# Patient Record
Sex: Female | Born: 1985
Health system: Southern US, Community
[De-identification: ages and names within clinical notes are randomized; demographics above are authoritative.]

## PROBLEM LIST (undated history)

## (undated) DIAGNOSIS — K219 Gastro-esophageal reflux disease without esophagitis: Secondary | ICD-10-CM

## (undated) DIAGNOSIS — N83209 Unspecified ovarian cyst, unspecified side: Secondary | ICD-10-CM

## (undated) DIAGNOSIS — E282 Polycystic ovarian syndrome: Secondary | ICD-10-CM

## (undated) HISTORY — DX: Gastro-esophageal reflux disease without esophagitis: K21.9

## (undated) HISTORY — DX: Polycystic ovarian syndrome: E28.2

---

## 2007-08-21 ENCOUNTER — Ambulatory Visit: Payer: Self-pay | Admitting: Emergency Medicine

## 2007-09-17 ENCOUNTER — Emergency Department: Payer: Self-pay | Admitting: Internal Medicine

## 2008-03-30 ENCOUNTER — Ambulatory Visit: Payer: Self-pay | Admitting: Internal Medicine

## 2010-03-14 ENCOUNTER — Emergency Department: Payer: Self-pay | Admitting: Emergency Medicine

## 2011-08-07 ENCOUNTER — Emergency Department: Payer: Self-pay | Admitting: Emergency Medicine

## 2011-09-28 ENCOUNTER — Emergency Department: Payer: Self-pay | Admitting: *Deleted

## 2012-02-26 ENCOUNTER — Emergency Department: Payer: Self-pay | Admitting: Emergency Medicine

## 2012-02-26 LAB — COMPREHENSIVE METABOLIC PANEL
Albumin: 3.9 g/dL (ref 3.4–5.0)
Alkaline Phosphatase: 57 U/L (ref 50–136)
Anion Gap: 10 (ref 7–16)
BUN: 14 mg/dL (ref 7–18)
Bilirubin,Total: 0.2 mg/dL (ref 0.2–1.0)
Calcium, Total: 9.4 mg/dL (ref 8.5–10.1)
Chloride: 108 mmol/L — ABNORMAL HIGH (ref 98–107)
Co2: 27 mmol/L (ref 21–32)
Creatinine: 0.73 mg/dL (ref 0.60–1.30)
EGFR (African American): >60
EGFR (Non-African Amer.): >60
Glucose: 123 mg/dL — ABNORMAL HIGH (ref 65–99)
Osmolality: 291 (ref 275–301)
Potassium: 3.8 mmol/L (ref 3.5–5.1)
SGOT(AST): 23 U/L (ref 15–37)
SGPT (ALT): 19 U/L
Sodium: 145 mmol/L (ref 136–145)
Total Protein: 8.6 g/dL — ABNORMAL HIGH (ref 6.4–8.2)

## 2012-02-26 LAB — URINALYSIS, COMPLETE
Bilirubin,UR: NEGATIVE
Glucose,UR: NEGATIVE mg/dL (ref 0–75)
Ketone: NEGATIVE
Nitrite: NEGATIVE
Ph: 5 (ref 4.5–8.0)
Protein: NEGATIVE
RBC,UR: 3 /HPF (ref 0–5)
Specific Gravity: 1.024 (ref 1.003–1.030)
Squamous Epithelial: 165
WBC UR: 13 /HPF (ref 0–5)

## 2012-02-26 LAB — CBC
HCT: 38.9 % (ref 35.0–47.0)
HGB: 13.2 g/dL (ref 12.0–16.0)
MCH: 28.3 pg (ref 26.0–34.0)
MCHC: 33.9 g/dL (ref 32.0–36.0)
MCV: 84 fL (ref 80–100)
Platelet: 233 10*3/uL (ref 150–440)
RBC: 4.66 10*6/uL (ref 3.80–5.20)
RDW: 13.7 % (ref 11.5–14.5)
WBC: 8.1 10*3/uL (ref 3.6–11.0)

## 2012-02-26 LAB — PREGNANCY, URINE: Pregnancy Test, Urine: NEGATIVE m[IU]/mL

## 2012-08-01 ENCOUNTER — Emergency Department: Payer: Self-pay | Admitting: Emergency Medicine

## 2012-12-19 ENCOUNTER — Emergency Department: Payer: Self-pay | Admitting: Emergency Medicine

## 2012-12-19 LAB — URINALYSIS, COMPLETE
Bilirubin,UR: NEGATIVE
Glucose,UR: NEGATIVE mg/dL (ref 0–75)
Leukocyte Esterase: NEGATIVE
Ph: 6 (ref 4.5–8.0)
RBC,UR: 1 /HPF (ref 0–5)
Specific Gravity: 1.003 (ref 1.003–1.030)
Squamous Epithelial: 31
WBC UR: 2 /HPF (ref 0–5)

## 2012-12-19 LAB — WET PREP, GENITAL

## 2012-12-21 LAB — BETA STREP CULTURE(ARMC)

## 2013-10-03 ENCOUNTER — Emergency Department: Payer: Self-pay | Admitting: Emergency Medicine

## 2013-10-03 LAB — URINALYSIS, COMPLETE
Bacteria: NONE SEEN
Glucose,UR: NEGATIVE mg/dL (ref 0–75)
Ketone: NEGATIVE
Ph: 6 (ref 4.5–8.0)
Protein: NEGATIVE
Specific Gravity: 1.028 (ref 1.003–1.030)
Squamous Epithelial: 11

## 2013-10-03 LAB — COMPREHENSIVE METABOLIC PANEL
Alkaline Phosphatase: 69 U/L (ref 50–136)
Anion Gap: 6 — ABNORMAL LOW (ref 7–16)
Bilirubin,Total: 0.2 mg/dL (ref 0.2–1.0)
Calcium, Total: 9.1 mg/dL (ref 8.5–10.1)
Creatinine: 0.84 mg/dL (ref 0.60–1.30)
EGFR (African American): >60
EGFR (Non-African Amer.): >60
Glucose: 93 mg/dL (ref 65–99)
Osmolality: 279 (ref 275–301)
Potassium: 3.9 mmol/L (ref 3.5–5.1)
SGOT(AST): 19 U/L (ref 15–37)

## 2013-10-03 LAB — GC/CHLAMYDIA PROBE AMP

## 2013-10-03 LAB — CBC
Platelet: 225 10*3/uL (ref 150–440)
RBC: 4.44 10*6/uL (ref 3.80–5.20)
RDW: 13.6 % (ref 11.5–14.5)

## 2013-10-03 LAB — WET PREP, GENITAL

## 2014-04-08 ENCOUNTER — Emergency Department (INDEPENDENT_AMBULATORY_CARE_PROVIDER_SITE_OTHER)
Admission: EM | Admit: 2014-04-08 | Discharge: 2014-04-08 | Disposition: A | Discharge status: Home / Self Care | Payer: Medicare Other | Source: Home / Self Care

## 2014-04-08 ENCOUNTER — Encounter (HOSPITAL_COMMUNITY): Payer: Self-pay | Admitting: Emergency Medicine

## 2014-04-08 ENCOUNTER — Emergency Department (INDEPENDENT_AMBULATORY_CARE_PROVIDER_SITE_OTHER): Payer: Medicare Other

## 2014-04-08 DIAGNOSIS — K5900 Constipation, unspecified: Secondary | ICD-10-CM

## 2014-04-08 DIAGNOSIS — K2900 Acute gastritis without bleeding: Secondary | ICD-10-CM

## 2014-04-08 DIAGNOSIS — H01009 Unspecified blepharitis unspecified eye, unspecified eyelid: Secondary | ICD-10-CM

## 2014-04-08 DIAGNOSIS — H01006 Unspecified blepharitis left eye, unspecified eyelid: Secondary | ICD-10-CM

## 2014-04-08 LAB — POCT I-STAT, CHEM 8
BUN: 12 mg/dL (ref 6–23)
CHLORIDE: 103 meq/L (ref 96–112)
Calcium, Ion: 1.23 mmol/L (ref 1.12–1.23)
Creatinine, Ser: 0.8 mg/dL (ref 0.50–1.10)
GLUCOSE: 89 mg/dL (ref 70–99)
HCT: 42 % (ref 36.0–46.0)
Hemoglobin: 14.3 g/dL (ref 12.0–15.0)
POTASSIUM: 4.1 meq/L (ref 3.7–5.3)
Sodium: 140 mEq/L (ref 137–147)
TCO2: 24 mmol/L (ref 0–100)

## 2014-04-08 LAB — POCT PREGNANCY, URINE: Preg Test, Ur: NEGATIVE

## 2014-04-08 MED ORDER — GI COCKTAIL ~~LOC~~
ORAL | Status: AC
  Filled 2014-04-08: qty 30

## 2014-04-08 MED ORDER — GI COCKTAIL ~~LOC~~: 30.0000 mL | Freq: Two times a day (BID) | ORAL | Status: DC

## 2014-04-08 MED ORDER — POLYMYXIN B-TRIMETHOPRIM 10000-0.1 UNIT/ML-% OP SOLN: 1.0000 [drp] | OPHTHALMIC | Status: DC

## 2014-04-08 MED ORDER — OMEPRAZOLE 20 MG PO CPDR: 20.0000 mg | DELAYED_RELEASE_CAPSULE | Freq: Every day | ORAL | Status: DC

## 2014-04-08 MED ORDER — GI COCKTAIL ~~LOC~~
30.0000 mL | Freq: Once | ORAL | Status: AC
  Administered 2014-04-08: 30 mL via ORAL

## 2014-04-08 MED ORDER — ONDANSETRON HCL 4 MG PO TABS: 4.0000 mg | ORAL_TABLET | Freq: Four times a day (QID) | ORAL | Status: DC

## 2014-04-08 MED ORDER — FAMOTIDINE 20 MG PO TABS
20.0000 mg | ORAL_TABLET | Freq: Once | ORAL | Status: AC
  Administered 2014-04-08: 20 mg via ORAL

## 2014-04-08 MED ORDER — FAMOTIDINE 20 MG PO TABS
ORAL_TABLET | ORAL | Status: AC
Start: 2014-04-08 — End: 2014-04-08
  Filled 2014-04-08: qty 1

## 2014-04-08 NOTE — ED Notes (Signed)
Patient returned to department with script wanting breakdown of gicocktail

## 2014-04-08 NOTE — ED Notes (Signed)
Abdominal pain and left eye swelling and pain.

## 2014-04-08 NOTE — Discharge Instructions (Signed)

## 2014-04-08 NOTE — ED Provider Notes (Signed)
CSN: [id number]     Arrival date & time 04/08/14  1235 History   First MD Initiated Contact with Patient 04/08/14 1358     Chief Complaint  Patient presents with  . Abdominal Pain  . Eye Pain   (Consider location/radiation/quality/duration/timing/severity/associated sxs/prior Treatment) HPI Comments: 28 year old female complaining of a sudden onset of midepigastric pain beginning at 0300 hours this morning. She vomited once during this time. Denies diarrhea, fever, chills, chest pain or shortness of breath. She recalls an episode similar to this approximately 2 years ago. She was seen at another facility and told she had inflammation in the stomach.  Can not remember the last time she had a nl BM. Yesterday small volume of hard stool.   History reviewed. No pertinent past medical history. History reviewed. No pertinent past surgical history. No family history on file. History  Substance Use Topics  . Smoking status: Never Smoker   . Smokeless tobacco: Not on file  . Alcohol Use: No   OB History   Grav Para Term Preterm Abortions TAB SAB Ect Mult Living                 Review of Systems  Constitutional: Positive for activity change and appetite change. Negative for fever and fatigue.  Respiratory: Negative.  Negative for cough, shortness of breath, wheezing and stridor.   Cardiovascular: Negative.   Gastrointestinal: Positive for vomiting, abdominal pain and constipation. Negative for nausea, diarrhea, blood in stool, abdominal distention and rectal pain.  Genitourinary: Negative.   Skin: Negative for rash.  Neurological: Negative.   Psychiatric/Behavioral: Negative.     Allergies  Review of patient's allergies indicates no known allergies.  Home Medications   Current Outpatient Rx  Name  Route  Sig  Dispense  Refill  . Alum & Mag Hydroxide-Simeth (GI COCKTAIL) SUSP suspension   Oral   Take 30 mLs by mouth 2 (two) times daily. As needed for stomach pain. Shake well.  200 mL   0   . omeprazole (PRILOSEC) 20 MG capsule   Oral   Take 1 capsule (20 mg total) by mouth daily.   30 capsule   0   . ondansetron (ZOFRAN) 4 MG tablet   Oral   Take 1 tablet (4 mg total) by mouth every 6 (six) hours. Prn nausea or vomiting   12 tablet   0    BP 112/77  Pulse 86  Temp(Src) 98.1 F (36.7 C) (Oral)  Resp 20  SpO2 100%  LMP 03/13/2014 Physical Exam  Nursing note and vitals reviewed. Constitutional: She is oriented to person, place, and time. She appears well-developed and well-nourished. No distress.  HENT:  Mouth/Throat: Oropharynx is clear and moist. No oropharyngeal exudate.  Eyes: Conjunctivae and EOM are normal.  Mild puffiness and redness to left upper lid.  Neck: Normal range of motion. Neck supple.  Cardiovascular: Normal rate, regular rhythm and normal heart sounds.   Pulmonary/Chest: Effort normal and breath sounds normal. No respiratory distress. She has no wheezes.  Abdominal: Soft. Bowel sounds are normal. She exhibits no mass. There is tenderness. There is guarding. There is no rebound.  Obese Tenderness primarily to the epigastrium and left to the MCL. Varying areas of tympani and dullness. Neg Murphy's sign.  Musculoskeletal: She exhibits no edema.  Lymphadenopathy:    She has no cervical adenopathy.  Neurological: She is alert and oriented to person, place, and time.  Skin: Skin is warm and dry.  Psychiatric: She has a  normal mood and affect.    ED Course  Procedures (including critical care time) Labs Review Labs Reviewed  POCT I-STAT, CHEM 8  POCT PREGNANCY, URINE   Imaging Review Dg Abd 2 Views  04/08/2014   CLINICAL DATA:  epigastric pain  EXAM: ABDOMEN - 2 VIEW  COMPARISON:  None.  FINDINGS: The bowel gas pattern is normal. There is no evidence of free air. No radio-opaque calculi or other significant radiographic abnormality is seen.  IMPRESSION: Negative.   Electronically Signed   By: Salome HolmesHector  Cooper M.D.   On:  04/08/2014 14:38     MDM   1. Gastritis, acute     Suspect gastritis, Diff: viral, PUD, helicobacter.  Post GI cocktail and famotidine stomach is feeling better, less pain. No V or D.  Miralax and lots water for constipation when stomach is better. Polytrim op for OS.      Hayden Rasmussenavid Fiora Weill, NP 04/08/14 1536

## 2014-04-08 NOTE — ED Notes (Signed)
Patient transported to X-ray 

## 2014-04-10 NOTE — ED Provider Notes (Signed)
Medical screening examination/treatment/procedure(s) were performed by resident physician or non-physician practitioner and as supervising physician I was immediately available for consultation/collaboration.   Sakia Schrimpf DOUGLAS MD.   Sarabeth Benton D Arminda Foglio, MD 04/10/14 1019 

## 2014-06-15 ENCOUNTER — Encounter (HOSPITAL_COMMUNITY): Payer: Self-pay | Admitting: Emergency Medicine

## 2014-06-15 ENCOUNTER — Emergency Department (HOSPITAL_COMMUNITY)
Admission: EM | Admit: 2014-06-15 | Discharge: 2014-06-16 | Disposition: A | Discharge status: Home / Self Care | Payer: Medicare Other | Attending: Emergency Medicine | Admitting: Emergency Medicine

## 2014-06-15 DIAGNOSIS — J3489 Other specified disorders of nose and nasal sinuses: Secondary | ICD-10-CM | POA: Diagnosis present

## 2014-06-15 DIAGNOSIS — J029 Acute pharyngitis, unspecified: Secondary | ICD-10-CM | POA: Insufficient documentation

## 2014-06-15 DIAGNOSIS — H9209 Otalgia, unspecified ear: Secondary | ICD-10-CM | POA: Diagnosis not present

## 2014-06-15 LAB — RAPID STREP SCREEN (MED CTR MEBANE ONLY): Streptococcus, Group A Screen (Direct): NEGATIVE

## 2014-06-15 MED ORDER — CETIRIZINE-PSEUDOEPHEDRINE ER 5-120 MG PO TB12: 1.0000 | ORAL_TABLET | Freq: Two times a day (BID) | ORAL | Status: DC

## 2014-06-15 MED ORDER — GUAIFENESIN ER 600 MG PO TB12: 600.0000 mg | ORAL_TABLET | Freq: Two times a day (BID) | ORAL | Status: DC

## 2014-06-15 MED ORDER — GI COCKTAIL ~~LOC~~
30.0000 mL | Freq: Once | ORAL | Status: AC
  Administered 2014-06-16: 30 mL via ORAL
  Filled 2014-06-15: qty 30

## 2014-06-15 MED ORDER — DIPHENHYDRAMINE HCL 12.5 MG/5ML PO SYRP: 25.0000 mg | ORAL_SOLUTION | Freq: Four times a day (QID) | ORAL | Status: DC

## 2014-06-15 MED ORDER — IBUPROFEN 400 MG PO TABS
800.0000 mg | ORAL_TABLET | Freq: Once | ORAL | Status: AC
  Administered 2014-06-16: 800 mg via ORAL
  Filled 2014-06-15: qty 2

## 2014-06-15 NOTE — ED Provider Notes (Signed)
CSN: [id number]     Arrival date & time 06/15/14  2211 History   First MD Initiated Contact with Patient 06/15/14 2345     Chief Complaint  Patient presents with  . Sore Throat  . Otalgia   HPI  History provided by the patient. The patient is a 28 year old female with no significant PMH presenting with complaints of sore throat and left earache. Symptoms first began earlier that day and have been persistent. She has pain in the throat anytime she tries to swallow or eat. Symptoms have been associated with some nasal congestion and drainage. She denies any coughing, nausea or vomiting. Denies any fever. Denies any known sick contacts. No recent travel. She has not used any medications to help with symptoms.    History reviewed. No pertinent past medical history. History reviewed. No pertinent past surgical history. No family history on file. History  Substance Use Topics  . Smoking status: Never Smoker   . Smokeless tobacco: Not on file  . Alcohol Use: No   OB History   Grav Para Term Preterm Abortions TAB SAB Ect Mult Living                 Review of Systems  Constitutional: Positive for chills. Negative for fever and diaphoresis.  HENT: Positive for congestion, rhinorrhea and sore throat.   Respiratory: Negative for cough.   Gastrointestinal: Negative for vomiting and diarrhea.  All other systems reviewed and are negative.     Allergies  Review of patient's allergies indicates no known allergies.  Home Medications   Prior to Admission medications   Medication Sig Start Date End Date Taking? Authorizing Provider  ondansetron (ZOFRAN) 4 MG tablet Take 1 tablet (4 mg total) by mouth every 6 (six) hours. Prn nausea or vomiting 04/08/14  Yes Hayden Rasmussenavid Mabe, NP   BP 118/76  Pulse 72  Temp(Src) 98.4 F (36.9 C) (Oral)  Resp 20  Wt 179 lb 12.8 oz (81.557 kg)  SpO2 100% Physical Exam  Nursing note and vitals reviewed. Constitutional: She is oriented to person, place, and  time. She appears well-developed and well-nourished. No distress.  HENT:  Head: Normocephalic and atraumatic.  Right Ear: Tympanic membrane normal.  Left Ear: Tympanic membrane normal.  Mouth/Throat: Oropharynx is clear and moist.  There is mild erythema and cobblestoning of the pharynx. Tonsils appear normal without erythema or exudate. Uvula midline.  Neck: Normal range of motion. Neck supple.  Cardiovascular: Normal rate and regular rhythm.   Pulmonary/Chest: Effort normal and breath sounds normal. No respiratory distress. She has no wheezes. She has no rales.  Abdominal: Soft.  Musculoskeletal: Normal range of motion.  Lymphadenopathy:    She has no cervical adenopathy.  Neurological: She is alert and oriented to person, place, and time.  Skin: Skin is warm and dry. No rash noted.  Psychiatric: She has a normal mood and affect. Her behavior is normal.       ED Course  Procedures   COORDINATION OF CARE:  Nursing notes reviewed. Vital signs reviewed. Initial pt interview and examination performed.   Filed Vitals:   06/15/14 2246 06/15/14 2347  BP: 102/77 118/76  Pulse: 87 72  Temp: 98.5 F (36.9 C) 98.4 F (36.9 C)  TempSrc: Oral Oral  Resp: 16 20  Weight: 179 lb 12.8 oz (81.557 kg)   SpO2: 100% 100%    11:50 PM-patient seen and evaluated. Patient well-appearing no acute distress. Afebrile. No concerning findings on exam. Negative strep test.  Suspect viral pharyngitis with postnasal drip.   Treatment plan initiated: Medications  ibuprofen (ADVIL,MOTRIN) tablet 800 mg (not administered)    Results for orders placed during the hospital encounter of 06/15/14  RAPID STREP SCREEN      Result Value Ref Range   Streptococcus, Group A Screen (Direct) NEGATIVE  NEGATIVE     MDM   Final diagnoses:  Pharyngitis        Angus Sellereter S Dammen, PA-C 06/16/14 [phone]

## 2014-06-15 NOTE — ED Notes (Signed)
Pt. reports left ear ache and sore throat with swelling onset today , denies fever / no cough , airway intact/respirations unlabored .

## 2014-06-16 ENCOUNTER — Encounter (HOSPITAL_COMMUNITY): Payer: Self-pay | Admitting: Emergency Medicine

## 2014-06-16 ENCOUNTER — Emergency Department (INDEPENDENT_AMBULATORY_CARE_PROVIDER_SITE_OTHER)
Admission: EM | Admit: 2014-06-16 | Discharge: 2014-06-16 | Disposition: A | Discharge status: Home / Self Care | Payer: Medicare Other | Source: Home / Self Care | Attending: Family Medicine | Admitting: Family Medicine

## 2014-06-16 DIAGNOSIS — H6980 Other specified disorders of Eustachian tube, unspecified ear: Secondary | ICD-10-CM | POA: Diagnosis not present

## 2014-06-16 DIAGNOSIS — R0982 Postnasal drip: Secondary | ICD-10-CM | POA: Diagnosis not present

## 2014-06-16 DIAGNOSIS — J029 Acute pharyngitis, unspecified: Secondary | ICD-10-CM | POA: Diagnosis not present

## 2014-06-16 DIAGNOSIS — J039 Acute tonsillitis, unspecified: Secondary | ICD-10-CM | POA: Diagnosis not present

## 2014-06-16 DIAGNOSIS — H6990 Unspecified Eustachian tube disorder, unspecified ear: Secondary | ICD-10-CM

## 2014-06-16 LAB — POCT RAPID STREP A: STREPTOCOCCUS, GROUP A SCREEN (DIRECT): NEGATIVE

## 2014-06-16 MED ORDER — AMOXICILLIN 500 MG PO CAPS: 1000.0000 mg | ORAL_CAPSULE | Freq: Two times a day (BID) | ORAL | Status: DC

## 2014-06-16 NOTE — ED Provider Notes (Signed)
CSN: [id number]     Arrival date & time 06/16/14  1238 History   First MD Initiated Contact with Patient 06/16/14 1345     Chief Complaint  Patient presents with  . Sore Throat  . Otalgia   (Consider location/radiation/quality/duration/timing/severity/associated sxs/prior Treatment) HPI Comments: 28 year old female presents with a complaint of sore throat, loss of voice, PND and bilateral ear pain. She was seen for the same pain early this morning in the emergency department. She was diagnosed with PMD in allergy-type symptoms and treated with OTC meds. The patient is only taking Mucinex. She states that her symptoms are worse particularly her throat. It is very painful to swallow And she feels like it is swelling. She now has a temperature of 99.3 and a pulse of 110.   Past Medical History  Diagnosis Date  . Pregnant    History reviewed. No pertinent past surgical history. No family history on file. History  Substance Use Topics  . Smoking status: Never Smoker   . Smokeless tobacco: Not on file  . Alcohol Use: No   OB History   Grav Para Term Preterm Abortions TAB SAB Ect Mult Living   1              Review of Systems  Constitutional: Positive for fever, activity change and appetite change.  HENT: Positive for congestion, postnasal drip, sore throat, trouble swallowing and voice change.   Respiratory: Negative for cough, chest tightness and shortness of breath.   Cardiovascular: Negative for chest pain.  Gastrointestinal: Negative.   Psychiatric/Behavioral: Negative.     Allergies  Review of patient's allergies indicates no known allergies.  Home Medications   Prior to Admission medications   Medication Sig Start Date End Date Taking? Authorizing Provider  amoxicillin (AMOXIL) 500 MG capsule Take 2 capsules (1,000 mg total) by mouth 2 (two) times daily. 06/16/14   Hayden Rasmussenavid Mabe, NP  cetirizine-pseudoephedrine (ZYRTEC-D) 5-120 MG per tablet Take 1 tablet by mouth 2 (two)  times daily. 06/15/14   Phill MutterPeter S Dammen, PA-C  diphenhydrAMINE (BENYLIN) 12.5 MG/5ML syrup Take 10 mLs (25 mg total) by mouth 4 (four) times daily. 06/15/14   Phill MutterPeter S Dammen, PA-C  guaiFENesin (MUCINEX) 600 MG 12 hr tablet Take 1 tablet (600 mg total) by mouth 2 (two) times daily. 06/15/14   Phill MutterPeter S Dammen, PA-C  ondansetron (ZOFRAN) 4 MG tablet Take 1 tablet (4 mg total) by mouth every 6 (six) hours. Prn nausea or vomiting 04/08/14   Hayden Rasmussenavid Mabe, NP   BP 106/72  Pulse 110  Temp(Src) 99.3 F (37.4 C) (Oral)  Resp 16  SpO2 98%  LMP 05/06/2014 Physical Exam  Nursing note and vitals reviewed. Constitutional: She is oriented to person, place, and time. She appears well-developed and well-nourished.  HENT:  Bilateral TMs are pearly grey, transparent and retracted.  Oropharynx with copious amounts of gray discharge and mildly red in the posterior aspect. However, there is now significant enlargement and deep erythema of the bilateral palatine tonsils with a few exudates. Airway is widely patent. There is no shift and the uvula from the midline.  Eyes: Conjunctivae and EOM are normal.  Neck: Normal range of motion. Neck supple.  Bilateral anterior cervical lymphadenopathy.  Cardiovascular: Regular rhythm.   Increased heart rate  Pulmonary/Chest: Effort normal and breath sounds normal.  Musculoskeletal: She exhibits no edema.  Lymphadenopathy:    She has cervical adenopathy.  Neurological: She is alert and oriented to person, place, and time. She exhibits  normal muscle tone.  Skin: Skin is warm and dry. No rash noted.    ED Course  Procedures (including critical care time) Labs Review Labs Reviewed  POCT RAPID STREP A ([facility] ONLY)    Imaging Review No results found. Results for orders placed during the hospital encounter of 06/16/14  POCT RAPID STREP A ([facility] ONLY)      Result Value Ref Range   Streptococcus, Group A Screen (Direct) NEGATIVE  NEGATIVE     MDM   1.  Tonsillitis with exudate   2. Pharyngitis   3. PND (post-nasal drip)   4. ETD (eustachian tube dysfunction)     There is a change in the OP exam from when seen in the ED. Now with erythematous, enlarged, swollen tonsils with few small exudates. Amoxil 1 gm bid Ibuprofen 600 q 8h Lots of cool liquids cepacol loz    Hayden Rasmussenavid Mabe, NP 06/16/14 1454

## 2014-06-16 NOTE — Discharge Instructions (Signed)
Pharyngitis °Pharyngitis is redness, pain, and swelling (inflammation) of your pharynx.  °CAUSES  °Pharyngitis is usually caused by infection. Most of the time, these infections are from viruses (viral) and are part of a cold. However, sometimes pharyngitis is caused by bacteria (bacterial). Pharyngitis can also be caused by allergies. Viral pharyngitis may be spread from person to person by coughing, sneezing, and personal items or utensils (cups, forks, spoons, toothbrushes). Bacterial pharyngitis may be spread from person to person by more intimate contact, such as kissing.  °SIGNS AND SYMPTOMS  °Symptoms of pharyngitis include:   °· Sore throat.   °· Tiredness (fatigue).   °· Low-grade fever.   °· Headache. °· Joint pain and muscle aches. °· Skin rashes. °· Swollen lymph nodes. °· Plaque-like film on throat or tonsils (often seen with bacterial pharyngitis). °DIAGNOSIS  °Your health care provider will ask you questions about your illness and your symptoms. Your medical history, along with a physical exam, is often all that is needed to diagnose pharyngitis. Sometimes, a rapid strep test is done. Other lab tests may also be done, depending on the suspected cause.  °TREATMENT  °Viral pharyngitis will usually get better in 3-4 days without the use of medicine. Bacterial pharyngitis is treated with medicines that kill germs (antibiotics).  °HOME CARE INSTRUCTIONS  °· Drink enough water and fluids to keep your urine clear or pale yellow.   °· Only take over-the-counter or prescription medicines as directed by your health care provider:   °· If you are prescribed antibiotics, make sure you finish them even if you start to feel better.   °· Do not take aspirin.   °· Get lots of rest.   °· Gargle with 8 oz of salt water (½ tsp of salt per 1 qt of water) as often as every 1-2 hours to soothe your throat.   °· Throat lozenges (if you are not at risk for choking) or sprays may be used to soothe your throat. °SEEK MEDICAL  CARE IF:  °· You have large, tender lumps in your neck. °· You have a rash. °· You cough up green, yellow-brown, or bloody spit. °SEEK IMMEDIATE MEDICAL CARE IF:  °· Your neck becomes stiff. °· You drool or are unable to swallow liquids. °· You vomit or are unable to keep medicines or liquids down. °· You have severe pain that does not go away with the use of recommended medicines. °· You have trouble breathing (not caused by a stuffy nose). °MAKE SURE YOU:  °· Understand these instructions. °· Will watch your condition. °· Will get help right away if you are not doing well or get worse. °Document Released: 12/16/2005 Document Revised: 10/06/2013 Document Reviewed: 08/23/2013 °Exit[employer]® Patient Information ©2015 [employer]. This information is not intended to replace advice given to you by your health care provider. Make sure you discuss any questions you have with your health care provider. ° °Sore Throat °A sore throat is pain, burning, irritation, or scratchiness of the throat. There is often pain or tenderness when swallowing or talking. A sore throat may be accompanied by other symptoms, such as coughing, sneezing, fever, and swollen neck glands. A sore throat is often the first sign of another sickness, such as a cold, flu, strep throat, or mononucleosis (commonly known as mono). Most sore throats go away without medical treatment. °CAUSES  °The most common causes of a sore throat include: °· A viral infection, such as a cold, flu, or mono. °· A bacterial infection, such as strep throat, tonsillitis, or whooping cough. °·   Seasonal allergies. °· Dryness in the air. °· Irritants, such as smoke or pollution. °· Gastroesophageal reflux disease (GERD). °HOME CARE INSTRUCTIONS  °· Only take over-the-counter medicines as directed by your caregiver. °· Drink enough fluids to keep your urine clear or pale yellow. °· Rest as needed. °· Try using throat sprays, lozenges, or sucking on hard candy to ease any pain (if  older than 4 years or as directed). °· Sip warm liquids, such as broth, herbal tea, or warm water with honey to relieve pain temporarily. You may also eat or drink cold or frozen liquids such as frozen ice pops. °· Gargle with salt water (mix 1 tsp salt with 8 oz of water). °· Do not smoke and avoid secondhand smoke. °· Put a cool-mist humidifier in your bedroom at night to moisten the air. You can also turn on a hot shower and sit in the bathroom with the door closed for 5-10 minutes. °SEEK IMMEDIATE MEDICAL CARE IF: °· You have difficulty breathing. °· You are unable to swallow fluids, soft foods, or your saliva. °· You have increased swelling in the throat. °· Your sore throat does not get better in 7 days. °· You have nausea and vomiting. °· You have a fever or persistent symptoms for more than 2-3 days. °· You have a fever and your symptoms suddenly get worse. °MAKE SURE YOU:  °· Understand these instructions. °· Will watch your condition. °· Will get help right away if you are not doing well or get worse. °Document Released: 01/23/2005 Document Revised: 12/02/2012 Document Reviewed: 08/23/2012 °ExitCare® Patient Information ©2015 [employer]. This information is not intended to replace advice given to you by your health care provider. Make sure you discuss any questions you have with your health care provider. ° °Tonsillitis °Tonsillitis is an infection of the throat that causes the tonsils to become red, tender, and swollen. Tonsils are collections of lymphoid tissue at the back of the throat. Each tonsil has crevices (crypts). Tonsils help fight nose and throat infections and keep infection from spreading to other parts of the body for the first 18 months of life.  °CAUSES °Sudden (acute) tonsillitis is usually caused by infection with streptococcal bacteria. Long-lasting (chronic) tonsillitis occurs when the crypts of the tonsils become filled with pieces of food and bacteria, which makes it easy for the  tonsils to become repeatedly infected. °SYMPTOMS  °Symptoms of tonsillitis include: °· A sore throat, with possible difficulty swallowing. °· White patches on the tonsils. °· Fever. °· Tiredness. °· New episodes of snoring during sleep, when you did not snore before. °· Small, foul-smelling, yellowish-white pieces of material (tonsilloliths) that you occasionally cough up or spit out. The tonsilloliths can also cause you to have bad breath. °DIAGNOSIS °Tonsillitis can be diagnosed through a physical exam. Diagnosis can be confirmed with the results of lab tests, including a throat culture. °TREATMENT  °The goals of tonsillitis treatment include the reduction of the severity and duration of symptoms and prevention of associated conditions. Symptoms of tonsillitis can be improved with the use of steroids to reduce the swelling. Tonsillitis caused by bacteria can be treated with antibiotic medicines. Usually, treatment with antibiotic medicines is started before the cause of the tonsillitis is known. However, if it is determined that the cause is not bacterial, antibiotic medicines will not treat the tonsillitis. If attacks of tonsillitis are severe and frequent, your health care provider may recommend surgery to remove the tonsils (tonsillectomy). °HOME CARE INSTRUCTIONS  °·   Rest as much as possible and get plenty of sleep.  Drink plenty of fluids. While the throat is very sore, eat soft foods or liquids, such as sherbet, soups, or instant breakfast drinks.  Eat frozen ice pops.  Gargle with a warm or cold liquid to help soothe the throat. Mix 1/4 teaspoon of salt and 1/4 teaspoon of baking soda in in 8 oz of water. SEEK MEDICAL CARE IF:   Large, tender lumps develop in your neck.  A rash develops.  A green, yellow-brown, or bloody substance is coughed up.  You are unable to swallow liquids or food for 24 hours.  You notice that only one of the tonsils is swollen. SEEK IMMEDIATE MEDICAL CARE IF:    You develop any new symptoms such as vomiting, severe headache, stiff neck, chest pain, or trouble breathing or swallowing.  You have severe throat pain along with drooling or voice changes.  You have severe pain, unrelieved with recommended medications.  You are unable to fully open the mouth.  You develop redness, swelling, or severe pain anywhere in the neck.  You have a fever. MAKE SURE YOU:   Understand these instructions.  Will watch your condition.  Will get help right away if you are not doing well or get worse. Document Released: 09/25/2005 Document Revised: 12/21/2013 Document Reviewed: 06/04/2013 [facility] Patient Information 2015 [place], [place]. This information is not intended to replace advice given to you by your health care provider. Make sure you discuss any questions you have with your health care provider.

## 2014-06-16 NOTE — ED Notes (Deleted)
Bed: UC09 Expected date:  Expected time:  Means of arrival:  Comments: Nixed at 1305

## 2014-06-16 NOTE — Discharge Instructions (Signed)
Your strep test was negative today.  Please use the medications prescribed to help with your symptoms.  Follow up with your primary care provider.   Pharyngitis Pharyngitis is redness, pain, and swelling (inflammation) of your pharynx.  CAUSES  Pharyngitis is usually caused by infection. Most of the time, these infections are from viruses (viral) and are part of a cold. However, sometimes pharyngitis is caused by bacteria (bacterial). Pharyngitis can also be caused by allergies. Viral pharyngitis may be spread from person to person by coughing, sneezing, and personal items or utensils (cups, forks, spoons, toothbrushes). Bacterial pharyngitis may be spread from person to person by more intimate contact, such as kissing.  SIGNS AND SYMPTOMS  Symptoms of pharyngitis include:   Sore throat.   Tiredness (fatigue).   Low-grade fever.   Headache.  Joint pain and muscle aches.  Skin rashes.  Swollen lymph nodes.  Plaque-like film on throat or tonsils (often seen with bacterial pharyngitis). DIAGNOSIS  Your health care provider will ask you questions about your illness and your symptoms. Your medical history, along with a physical exam, is often all that is needed to diagnose pharyngitis. Sometimes, a rapid strep test is done. Other lab tests may also be done, depending on the suspected cause.  TREATMENT  Viral pharyngitis will usually get better in 3-4 days without the use of medicine. Bacterial pharyngitis is treated with medicines that kill germs (antibiotics).  HOME CARE INSTRUCTIONS   Drink enough water and fluids to keep your urine clear or pale yellow.   Only take over-the-counter or prescription medicines as directed by your health care provider:   If you are prescribed antibiotics, make sure you finish them even if you start to feel better.   Do not take aspirin.   Get lots of rest.   Gargle with 8 oz of salt water ( tsp of salt per 1 qt of water) as often as every  1-2 hours to soothe your throat.   Throat lozenges (if you are not at risk for choking) or sprays may be used to soothe your throat. SEEK MEDICAL CARE IF:   You have large, tender lumps in your neck.  You have a rash.  You cough up green, yellow-brown, or bloody spit. SEEK IMMEDIATE MEDICAL CARE IF:   Your neck becomes stiff.  You drool or are unable to swallow liquids.  You vomit or are unable to keep medicines or liquids down.  You have severe pain that does not go away with the use of recommended medicines.  You have trouble breathing (not caused by a stuffy nose). MAKE SURE YOU:   Understand these instructions.  Will watch your condition.  Will get help right away if you are not doing well or get worse. Document Released: 12/16/2005 Document Revised: 10/06/2013 Document Reviewed: 08/23/2013 [facility] Patient Information 2015 [place], [place]. This information is not intended to replace advice given to you by your health care provider. Make sure you discuss any questions you have with your health care provider.    Salt Water Gargle This solution will help make your mouth and throat feel better. HOME CARE INSTRUCTIONS   Mix 1 teaspoon of salt in 8 ounces of warm water.  Gargle with this solution as much or often as you need or as directed. Swish and gargle gently if you have any sores or wounds in your mouth.  Do not swallow this mixture. Document Released: 09/19/2004 Document Revised: 03/09/2012 Document Reviewed: 02/10/2009 [facility] Patient Information 2015 [place], [place].  This information is not intended to replace advice given to you by your health care provider. Make sure you discuss any questions you have with your health care provider. ° °

## 2014-06-16 NOTE — ED Notes (Signed)
Sore throat, hard to swallow, bilateral ear pain.

## 2014-06-16 NOTE — ED Provider Notes (Signed)
Medical screening examination/treatment/procedure(s) were performed by non-physician practitioner and as supervising physician I was immediately available for consultation/collaboration.   EKG Interpretation None       Derwood KaplanAnkit Joanmarie Tsang, MD 06/16/14 [phone]

## 2014-06-17 LAB — CULTURE, GROUP A STREP

## 2014-06-18 LAB — CULTURE, GROUP A STREP

## 2014-06-20 NOTE — ED Provider Notes (Signed)
Medical screening examination/treatment/procedure(s) were performed by non-physician practitioner and as supervising physician I was immediately available for consultation/collaboration.  Leslee Homeavid Ronika Kelson, M.D.  Reuben Likesavid C Mckynna Vanloan, MD 06/20/14 [phone]

## 2014-10-31 ENCOUNTER — Emergency Department (HOSPITAL_COMMUNITY)
Admission: EM | Admit: 2014-10-31 | Discharge: 2014-10-31 | Disposition: A | Discharge status: Home / Self Care | Payer: Medicare Other | Attending: Emergency Medicine | Admitting: Emergency Medicine

## 2014-10-31 ENCOUNTER — Encounter (HOSPITAL_COMMUNITY): Payer: Self-pay | Admitting: Emergency Medicine

## 2014-10-31 DIAGNOSIS — F458 Other somatoform disorders: Secondary | ICD-10-CM | POA: Diagnosis not present

## 2014-10-31 DIAGNOSIS — Z79899 Other long term (current) drug therapy: Secondary | ICD-10-CM | POA: Insufficient documentation

## 2014-10-31 DIAGNOSIS — K219 Gastro-esophageal reflux disease without esophagitis: Secondary | ICD-10-CM

## 2014-10-31 DIAGNOSIS — R110 Nausea: Secondary | ICD-10-CM

## 2014-10-31 DIAGNOSIS — J029 Acute pharyngitis, unspecified: Secondary | ICD-10-CM | POA: Insufficient documentation

## 2014-10-31 DIAGNOSIS — Z792 Long term (current) use of antibiotics: Secondary | ICD-10-CM | POA: Diagnosis not present

## 2014-10-31 DIAGNOSIS — R0989 Other specified symptoms and signs involving the circulatory and respiratory systems: Secondary | ICD-10-CM

## 2014-10-31 DIAGNOSIS — M722 Plantar fascial fibromatosis: Secondary | ICD-10-CM | POA: Diagnosis not present

## 2014-10-31 MED ORDER — ONDANSETRON 4 MG PO TBDP: 4.0000 mg | ORAL_TABLET | Freq: Three times a day (TID) | ORAL | Status: DC | PRN

## 2014-10-31 MED ORDER — OMEPRAZOLE 20 MG PO CPDR: 20.0000 mg | DELAYED_RELEASE_CAPSULE | Freq: Every day | ORAL | Status: DC

## 2014-10-31 NOTE — Discharge Instructions (Signed)
Your reflux is causing your sore throat symptoms. Use omeprazole as directed to help with this. Additionally, avoid any spicy foods or foods that trigger your symptoms. Use zofran as directed, as needed for nausea. Elevate your head at night, and avoid laying down for at least 30 minutes after eating a meal. For your foot pain, it sounds like plantar fasciitis, which is irritation to the muscles and tissues on the arch of your foot. Use a frozen water bottle to stretch and ice the area every night and morning. In the morning before taking your first steps, stretch your foot for 15 minutes to help with your pain. You may use over the counter products to help with this as well, such as insoles designed for this issue. Try using tylenol or motrin for pain, but always take these with food. See your regular doctor for recheck in 1 week, and for any referrals. Return to the ER for changes or worsening of your symptoms.   Gastroesophageal Reflux Disease, Adult Gastroesophageal reflux disease (GERD) happens when acid from your stomach flows up into the esophagus. When acid comes in contact with the esophagus, the acid causes soreness (inflammation) in the esophagus. Over time, GERD may create small holes (ulcers) in the lining of the esophagus. CAUSES   Increased body weight. This puts pressure on the stomach, making acid rise from the stomach into the esophagus.  Smoking. This increases acid production in the stomach.  Drinking alcohol. This causes decreased pressure in the lower esophageal sphincter (valve or ring of muscle between the esophagus and stomach), allowing acid from the stomach into the esophagus.  Late evening meals and a full stomach. This increases pressure and acid production in the stomach.  A malformed lower esophageal sphincter. Sometimes, no cause is found. SYMPTOMS   Burning pain in the lower part of the mid-chest behind the breastbone and in the mid-stomach area. This may occur  twice a week or more often.  Trouble swallowing.  Sore throat.  Dry cough.  Asthma-like symptoms including chest tightness, shortness of breath, or wheezing. DIAGNOSIS  Your caregiver may be able to diagnose GERD based on your symptoms. In some cases, X-rays and other tests may be done to check for complications or to check the condition of your stomach and esophagus. TREATMENT  Your caregiver may recommend over-the-counter or prescription medicines to help decrease acid production. Ask your caregiver before starting or adding any new medicines.  HOME CARE INSTRUCTIONS   Change the factors that you can control. Ask your caregiver for guidance concerning weight loss, quitting smoking, and alcohol consumption.  Avoid foods and drinks that make your symptoms worse, such as:  Caffeine or alcoholic drinks.  Chocolate.  Peppermint or mint flavorings.  Garlic and onions.  Spicy foods.  Citrus fruits, such as oranges, lemons, or limes.  Tomato-based foods such as sauce, chili, salsa, and pizza.  Fried and fatty foods.  Avoid lying down for the 3 hours prior to your bedtime or prior to taking a nap.  Eat small, frequent meals instead of large meals.  Wear loose-fitting clothing. Do not wear anything tight around your waist that causes pressure on your stomach.  Raise the head of your bed 6 to 8 inches with wood blocks to help you sleep. Extra pillows will not help.  Only take over-the-counter or prescription medicines for pain, discomfort, or fever as directed by your caregiver.  Do not take aspirin, ibuprofen, or other nonsteroidal anti-inflammatory drugs (NSAIDs). SEEK IMMEDIATE MEDICAL  CARE IF:   You have pain in your arms, neck, jaw, teeth, or back.  Your pain increases or changes in intensity or duration.  You develop nausea, vomiting, or sweating (diaphoresis).  You develop shortness of breath, or you faint.  Your vomit is green, yellow, black, or looks like  coffee grounds or blood.  Your stool is red, bloody, or black. These symptoms could be signs of other problems, such as heart disease, gastric bleeding, or esophageal bleeding. MAKE SURE YOU:   Understand these instructions.  Will watch your condition.  Will get help right away if you are not doing well or get worse. Document Released: 09/25/2005 Document Revised: 03/09/2012 Document Reviewed: 07/05/2011 [facility] Patient Information 2015 [place], [place]. This information is not intended to replace advice given to you by your health care provider. Make sure you discuss any questions you have with your health care provider.  Food Choices for Gastroesophageal Reflux Disease When you have gastroesophageal reflux disease (GERD), the foods you eat and your eating habits are very important. Choosing the right foods can help ease your discomfort.  WHAT GUIDELINES DO I NEED TO FOLLOW?   Choose fruits, vegetables, whole grains, and low-fat dairy products.   Choose low-fat meat, fish, and poultry.  Limit fats such as oils, salad dressings, butter, nuts, and avocado.   Keep a food diary. This helps you identify foods that cause symptoms.   Avoid foods that cause symptoms. These may be different for everyone.   Eat small meals often instead of 3 large meals a day.   Eat your meals slowly, in a place where you are relaxed.   Limit fried foods.   Cook foods using methods other than frying.   Avoid drinking alcohol.   Avoid drinking large amounts of liquids with your meals.   Avoid bending over or lying down until 2-3 hours after eating.  WHAT FOODS ARE NOT RECOMMENDED?  These are some foods and drinks that may make your symptoms worse: Vegetables Tomatoes. Tomato juice. Tomato and spaghetti sauce. Chili peppers. Onion and garlic. Horseradish. Fruits Oranges, grapefruit, and lemon (fruit and juice). Meats High-fat meats, fish, and poultry. This includes hot dogs, ribs, ham,  sausage, salami, and bacon. Dairy Whole milk and chocolate milk. Sour cream. Cream. Butter. Ice cream. Cream cheese.  Drinks Coffee and tea. Bubbly (carbonated) drinks or energy drinks. Condiments Hot sauce. Barbecue sauce.  Sweets/Desserts Chocolate and cocoa. Donuts. Peppermint and spearmint. Fats and Oils High-fat foods. This includes [place] fries and potato chips. Other Vinegar. Strong spices. This includes black pepper, white pepper, red pepper, cayenne, curry powder, cloves, ginger, and chili powder. The items listed above may not be a complete list of foods and drinks to avoid. Contact your dietitian for more information. Document Released: 06/16/2012 Document Revised: 12/21/2013 Document Reviewed: 10/20/2013 [facility] Patient Information 2015 [place], [place]. This information is not intended to replace advice given to you by your health care provider. Make sure you discuss any questions you have with your health care provider.  Globus Syndrome Globus Syndrome is a feeling of a lump or a sensation of something caught in your throat. Eating food or drinking fluids does not seem to get rid of it. Yet it is not noticeable during the actual act of swallowing food or liquids. Usually there is nothing physically wrong. It is troublesome because it is an unpleasant sensation which is sometimes difficult to ignore and at times may seem to worsen. The syndrome is quite common. It is  estimated 45% of the population experiences features of the condition at some stage during their lives. The symptoms are usually temporary. The largest group of people who feel the need to seek medical treatment is females between the ages of 1530 to 6860.  CAUSES  Globus Syndrome appears to be triggered by or aggravated by stress, anxiety and depression.  Tension related to stress could product abnormal muscle spasms in the esophagus which would account for the sensation of a lump or ball in your throat.  Frequent  swallowing or drying of the throat caused by anxiety or other strong emotions can also produce this uncomfortable sensation in your throat.  Fear and sadness can be expressed by the body in many ways. For instance, if you had a relative with throat cancer you might become overly concerned about your own health and develop uncomfortable sensations in your throat.  The reaction to a crisis or a trauma event in your life can take the form of a lump in your throat. It is as if you are indirectly saying you can not handle or "swallow" one more thing. DIAGNOSIS  Usually your caregiver will know what is wrong by talking to you and examining you. If the condition persists for several days, more testing may be done to make sure there is not another problem present. This is usually not the case. TREATMENT   Reassurance is often the best treatment available. Usually the problem leaves without treatment over several days.  Sometimes anti-anxiety medications may be prescribed.  Counseling or talk therapy can also help with strong underlying emotions.  Note that in most cases this is not something that keeps coming back and you should not be concerned or worried. Document Released: 03/07/2004 Document Revised: 03/09/2012 Document Reviewed: 08/04/2008 [facility] Patient Information 2015 [place], [place]. This information is not intended to replace advice given to you by your health care provider. Make sure you discuss any questions you have with your health care provider.  Heartburn Heartburn is a painful, burning feeling in the chest. It may feel worse when you lie down or bend over. Heartburn is caused by stomach acid moving into the tube that carries food from the mouth to the stomach (esophagus). HOME CARE  Take all medicine as told by your doctor.  Raise the head of your bed with blocks only as told by your doctor.  Do not exercise right after eating.  Avoid eating 2 or 3 hours before bed. Do not  lie down right after eating.  Eat small meals throughout the day instead of 3 large meals.  Stop smoking if you smoke.  Keep up a healthy weight.  Avoid foods that give you heartburn. Foods you may want to avoid include:  Peppers.  Chocolate.  High-fat foods, including fried foods.  Spicy foods.  Garlic and onions.  Citrus fruits, including oranges, grapefruit, lemons, and limes.  Food containing tomatoes or tomato products.  Mint.  Bubbly (carbonated) drinks and drinks with caffeine.  Vinegar. GET HELP RIGHT AWAY IF:  You have bad chest pain that goes down your arm or into your jaw or neck.  You feel sweaty, dizzy, or lightheaded.  You have trouble breathing.  You throw up (vomit) blood.  You have trouble or pain when swallowing.  You have bloody or black poop (stool).  You have heartburn more than 3 times a week, for more than 2 weeks. MAKE SURE YOU:  Understand these instructions.  Will watch your condition.  Will get  help right away if you are not doing well or get worse. Document Released: 08/28/2011 Document Revised: 03/09/2012 Document Reviewed: 08/28/2011 [facility] Patient Information 2015 [place], [place]. This information is not intended to replace advice given to you by your health care provider. Make sure you discuss any questions you have with your health care provider.  Plantar Fasciitis Plantar fasciitis is a common condition that causes foot pain. It is soreness (inflammation) of the band of tough fibrous tissue on the bottom of the foot that runs from the heel bone (calcaneus) to the ball of the foot. The cause of this soreness may be from excessive standing, poor fitting shoes, running on hard surfaces, being overweight, having an abnormal walk, or overuse (this is common in runners) of the painful foot or feet. It is also common in aerobic exercise dancers and ballet dancers. SYMPTOMS  Most people with plantar fasciitis complain of:  Severe  pain in the morning on the bottom of their foot especially when taking the first steps out of bed. This pain recedes after a few minutes of walking.  Severe pain is experienced also during walking following a long period of inactivity.  Pain is worse when walking barefoot or up stairs DIAGNOSIS   Your caregiver will diagnose this condition by examining and feeling your foot.  Special tests such as X-rays of your foot, are usually not needed. PREVENTION   Consult a sports medicine professional before beginning a new exercise program.  Walking programs offer a good workout. With walking there is a lower chance of overuse injuries common to runners. There is less impact and less jarring of the joints.  Begin all new exercise programs slowly. If problems or pain develop, decrease the amount of time or distance until you are at a comfortable level.  Wear good shoes and replace them regularly.  Stretch your foot and the heel cords at the back of the ankle (Achilles tendon) both before and after exercise.  Run or exercise on even surfaces that are not hard. For example, asphalt is better than pavement.  Do not run barefoot on hard surfaces.  If using a treadmill, vary the incline.  Do not continue to workout if you have foot or joint problems. Seek professional help if they do not improve. HOME CARE INSTRUCTIONS   Avoid activities that cause you pain until you recover.  Use ice or cold packs on the problem or painful areas after working out.  Only take over-the-counter or prescription medicines for pain, discomfort, or fever as directed by your caregiver.  Soft shoe inserts or athletic shoes with air or gel sole cushions may be helpful.  If problems continue or become more severe, consult a sports medicine caregiver or your own health care provider. Cortisone is a potent anti-inflammatory medication that may be injected into the painful area. You can discuss this treatment with your  caregiver. MAKE SURE YOU:   Understand these instructions.  Will watch your condition.  Will get help right away if you are not doing well or get worse. Document Released: 09/10/2001 Document Revised: 03/09/2012 Document Reviewed: 11/09/2008 [facility] Patient Information 2015 [place], [place]. This information is not intended to replace advice given to you by your health care provider. Make sure you discuss any questions you have with your health care provider.

## 2014-10-31 NOTE — ED Notes (Signed)
Pt. States in the morning when she gets out of bed, she has pain in heels radiating up to calfs x1 month. Reports sore throat. Also reports brown vaginal discharge with foul smell x2weeks

## 2014-10-31 NOTE — ED Provider Notes (Signed)
CSN: [id number]     Arrival date & time 10/31/14  1227 History   First MD Initiated Contact with Patient 10/31/14 1604     Chief Complaint  Patient presents with  . Sore Throat  . Foot Pain     (Consider location/radiation/quality/duration/timing/severity/associated sxs/prior Treatment) HPI Comments: Mariah Rosales is a 28 y.o. female with a PMHx of GERD, who presents to the ED with complaints of 2-353months of intermittent midline sore throat. She states she has seen her PCP and was diagnosed with reflux, given magic mouthwash which did not help, but that since then she hasn't followed up with her. Describes pain as 6/10 aching intermittent nonradiating pain, worse with swallowing and smoking. Has not tried anything else for the pain. States she has an associated globus sensation, and a sensation that food gets stuck but reports that swallowing repeatedly helps relieve this sensation. Denies dysphagia. Endorses intermittent nausea and self-induced vomiting to relieve this sensation, which she was told was making her sore throat worse but has not stopped. 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 Denies radiation of pain, and states it's a soreness just in the heel. Has not tried anything for this. Denies fevers, chills, URI symptoms, cough, CP, SOB, abd pain, diarrhea, constipation, coffee-ground emesis, hematemesis, hematochezia, melena, obstipation, vaginal symptoms, myalgias, paresthesias, or neuro deficits.   Patient is a 28 y.o. female presenting with pharyngitis. The history is provided by the patient. No language interpreter was used.  Sore Throat This is a chronic problem. The current episode started more than 1 month ago. The problem occurs intermittently. The problem has been  unchanged. Associated symptoms include arthralgias (b/l heel pain), nausea (intermittent) and a sore throat. Pertinent negatives include no abdominal pain, anorexia, change in bowel habit, chest pain, chills, congestion, coughing, diaphoresis, fever, headaches, joint swelling, myalgias, neck pain, numbness, rash, swollen glands, urinary symptoms, vomiting (only self induced) or weakness. The symptoms are aggravated by swallowing and smoking. Treatments tried: magic mouthwash given by PCP. The treatment provided no relief.    Past Medical History  Diagnosis Date  . Pregnant    History reviewed. No pertinent past surgical history. No family history on file. History  Substance Use Topics  . Smoking status: Never Smoker   . Smokeless tobacco: Not on file  . Alcohol Use: No   OB History    Gravida Para Term Preterm AB TAB SAB Ectopic Multiple Living   1              Review of Systems  Constitutional: Negative for fever, chills and diaphoresis.  HENT: Positive for sore throat. Negative for congestion, drooling, postnasal drip, rhinorrhea, sinus pressure, sneezing, trouble swallowing and voice change.   Respiratory: Negative for cough, chest tightness and shortness of breath.   Cardiovascular: Negative for chest pain.  Gastrointestinal: Positive for nausea (intermittent). Negative for vomiting (only self induced), abdominal pain, diarrhea, constipation, blood in stool, anorexia and change in bowel habit.  Genitourinary: Negative for dysuria, hematuria, decreased urine volume, vaginal bleeding, vaginal discharge and vaginal pain.  Musculoskeletal: Positive for arthralgias (b/l heel pain). Negative for myalgias, back pain, joint swelling and neck pain.  Skin: Negative for rash.  Neurological: Negative for dizziness, weakness, numbness and headaches.  10 Systems reviewed and are negative for acute change except as noted in the HPI.    Allergies  Review of patient's allergies indicates no  known allergies.  Home Medications   Prior to Admission medications   Medication Sig Start Date End Date Taking? Authorizing Provider  amoxicillin (AMOXIL) 500 MG capsule Take 2 capsules (1,000 mg total) by mouth 2 (two) times daily. 06/16/14   Hayden Rasmussen, NP  cetirizine-pseudoephedrine (ZYRTEC-D) 5-120 MG per tablet Take 1 tablet by mouth 2 (two) times daily. 06/15/14   Phill Mutter Dammen, PA-C  diphenhydrAMINE (BENYLIN) 12.5 MG/5ML syrup Take 10 mLs (25 mg total) by mouth 4 (four) times daily. 06/15/14   Phill Mutter Dammen, PA-C  guaiFENesin (MUCINEX) 600 MG 12 hr tablet Take 1 tablet (600 mg total) by mouth 2 (two) times daily. 06/15/14   Phill Mutter Dammen, PA-C  ondansetron (ZOFRAN) 4 MG tablet Take 1 tablet (4 mg total) by mouth every 6 (six) hours. Prn nausea or vomiting 04/08/14   Hayden Rasmussen, NP   BP 125/76 mmHg  Pulse 88  Temp(Src) 97.4 F (36.3 C) (Oral)  Resp 18  SpO2 99%  LMP 10/19/2014  Breastfeeding? Unknown Physical Exam  Constitutional: She is oriented to person, place, and time. Vital signs are normal. She appears well-developed and well-nourished.  Non-toxic appearance. No distress.  Afebrile, nontoxic, NAD  HENT:  Head: Normocephalic and atraumatic.  Right Ear: Hearing, tympanic membrane, external ear and ear canal normal.  Left Ear: Hearing, tympanic membrane, external ear and ear canal normal.  Nose: Nose normal.  Mouth/Throat: Uvula is midline, oropharynx is clear and moist and mucous membranes are normal. No trismus in the jaw. No uvula swelling.  B/l 2+ hypertrophic tonsils, no erythema or exudates Uvula midline, no PTA, no trismus or drooling. MMM. Ears clear bilaterally Nose clear  Eyes: Conjunctivae and EOM are normal. Right eye exhibits no discharge. Left eye exhibits no discharge.  Neck: Normal range of motion. Neck supple. Tracheal tenderness present.    Mild mildline tenderness, no LAD, no palpable thyromegaly  Cardiovascular: Normal rate, regular rhythm, normal  heart sounds and intact distal pulses.  Exam reveals no gallop and no friction rub.   No murmur heard. Pulmonary/Chest: Effort normal and breath sounds normal. No respiratory distress. She has no decreased breath sounds. She has no wheezes. She has no rhonchi. She has no rales.  Abdominal: Soft. Normal appearance and bowel sounds are normal. She exhibits no distension. There is no tenderness. There is no rigidity, no rebound and no guarding.  Musculoskeletal: Normal range of motion.  B/l heels with TTP at insertion of plantar fascia FROM intact in ankles and all digits Pulses intact in all extremities Sensation and strength grossly intact Gait WNL  Neurological: She is alert and oriented to person, place, and time. She has normal strength. No sensory deficit. Gait normal.  Skin: Skin is warm, dry and intact. No rash noted. No erythema.  Psychiatric: She has a normal mood and affect.  Nursing note and vitals reviewed.   ED Course  Procedures (including critical care time) Labs Review Labs Reviewed - No data to display  Imaging Review No results found.   EKG Interpretation None      MDM   Final diagnoses:  Gastroesophageal reflux disease without esophagitis  Globus sensation  Sore throat  Plantar fasciitis, bilateral  Nausea    27y/o female with globus sensation, reflux, and intermittent sore throat x2-3 months. Throat with hypertrophic tonsils but do not  appear erythematous or infected. Seems to be related to reflux. Discussed use of prilosec and diet modifications as well as lifestyle modifications to help with reflux. Will give zofran for her intermittent nausea, which she states is at baseline. Denies abd pain. Doubt need for further evaluation or work up. Also having plantar fasciitis type pain. Discussed use of tylenol/motrin for pain, and using frozen water bottle and stretching to help with this pain. Will have her f/up with her PCP for recheck and for any need for  referral. Pt did not mention vaginal discharge to me, but stated this to the nurse and said this had been going on for 2 weeks but hadn't gone to see her PCP. She denied abd pain today. Since she has had ongoing symptoms, and this was not mentioned to me during exam, will have her f/up with her PCP for this issue. I explained the diagnosis and have given explicit precautions to return to the ER including for any other new or worsening symptoms. The patient understands and accepts the medical plan as it's been dictated and I have answered their questions. Discharge instructions concerning home care and prescriptions have been given. The patient is STABLE and is discharged to home in good condition.  BP 125/76 mmHg  Pulse 88  Temp(Src) 97.4 F (36.3 C) (Oral)  Resp 18  SpO2 99%  LMP 10/19/2014  Breastfeeding? Unknown  Meds ordered this encounter  Medications  . ondansetron (ZOFRAN ODT) 4 MG disintegrating tablet    Sig: Take 1 tablet (4 mg total) by mouth every 8 (eight) hours as needed for nausea or vomiting.    Dispense:  15 tablet    Refill:  0    Order Specific Question:  Supervising Provider    Answer:  Eber HongMILLER, BRIAN D [[id number]]  . omeprazole (PRILOSEC) 20 MG capsule    Sig: Take 1 capsule (20 mg total) by mouth daily.    Dispense:  30 capsule    Refill:  0    Order Specific Question:  Supervising Provider    Answer:  Vida RollerMILLER, BRIAN D [address][id number]]       Braiden Rodman Strupp Camprubi-Soms, PA-C 10/31/14 1708

## 2014-10-31 NOTE — ED Notes (Signed)
Patient states she came in for sore throat x 2 days.   Patient states that she also states that she has bilateral feet pain.   Patient states while in triage that she is also having discharge with a foul smell from vaginal area.    Patient does have a primary doctor, but states that she didn't have a vehicle to get there.

## 2015-01-10 DIAGNOSIS — R109 Unspecified abdominal pain: Secondary | ICD-10-CM | POA: Diagnosis not present

## 2015-01-10 DIAGNOSIS — E282 Polycystic ovarian syndrome: Secondary | ICD-10-CM | POA: Diagnosis not present

## 2015-01-10 DIAGNOSIS — R350 Frequency of micturition: Secondary | ICD-10-CM | POA: Diagnosis not present

## 2015-02-03 DIAGNOSIS — E282 Polycystic ovarian syndrome: Secondary | ICD-10-CM | POA: Diagnosis not present

## 2015-02-17 DIAGNOSIS — E282 Polycystic ovarian syndrome: Secondary | ICD-10-CM | POA: Diagnosis not present

## 2015-02-20 DIAGNOSIS — Z13220 Encounter for screening for lipoid disorders: Secondary | ICD-10-CM | POA: Diagnosis not present

## 2015-02-20 DIAGNOSIS — Z0000 Encounter for general adult medical examination without abnormal findings: Secondary | ICD-10-CM | POA: Diagnosis not present

## 2015-02-20 DIAGNOSIS — Z23 Encounter for immunization: Secondary | ICD-10-CM | POA: Diagnosis not present

## 2015-03-07 DIAGNOSIS — N926 Irregular menstruation, unspecified: Secondary | ICD-10-CM | POA: Diagnosis not present

## 2015-03-07 DIAGNOSIS — Z0184 Encounter for antibody response examination: Secondary | ICD-10-CM | POA: Diagnosis not present

## 2015-03-15 DIAGNOSIS — N979 Female infertility, unspecified: Secondary | ICD-10-CM | POA: Diagnosis not present

## 2015-03-28 ENCOUNTER — Emergency Department (INDEPENDENT_AMBULATORY_CARE_PROVIDER_SITE_OTHER): Payer: Medicare Other

## 2015-03-28 ENCOUNTER — Emergency Department (INDEPENDENT_AMBULATORY_CARE_PROVIDER_SITE_OTHER)
Admission: EM | Admit: 2015-03-28 | Discharge: 2015-03-28 | Disposition: A | Discharge status: Home / Self Care | Payer: Medicare Other | Source: Home / Self Care | Attending: Emergency Medicine | Admitting: Emergency Medicine

## 2015-03-28 ENCOUNTER — Encounter (HOSPITAL_COMMUNITY): Payer: Self-pay | Admitting: Emergency Medicine

## 2015-03-28 DIAGNOSIS — M25531 Pain in right wrist: Secondary | ICD-10-CM | POA: Diagnosis not present

## 2015-03-28 DIAGNOSIS — S63502A Unspecified sprain of left wrist, initial encounter: Secondary | ICD-10-CM

## 2015-03-28 DIAGNOSIS — K219 Gastro-esophageal reflux disease without esophagitis: Secondary | ICD-10-CM | POA: Diagnosis not present

## 2015-03-28 DIAGNOSIS — S60221A Contusion of right hand, initial encounter: Secondary | ICD-10-CM

## 2015-03-28 DIAGNOSIS — S6991XA Unspecified injury of right wrist, hand and finger(s), initial encounter: Secondary | ICD-10-CM | POA: Diagnosis not present

## 2015-03-28 MED ORDER — OMEPRAZOLE 20 MG PO CPDR: 20.0000 mg | DELAYED_RELEASE_CAPSULE | Freq: Every day | ORAL | Status: DC

## 2015-03-28 NOTE — ED Notes (Signed)
Patient reports right hand hit by a door, pain in wrist and forearm.  Able to move fingers, radial pulse 2 plus Patient has a history of reflux and is out of omeprazole.  Requesting refill

## 2015-03-28 NOTE — ED Provider Notes (Signed)
CSN: [id number]     Arrival date & time 03/28/15  1958 History   First MD Initiated Contact with Patient 03/28/15 2041     Chief Complaint  Patient presents with  . Hand Pain  . Gastrophageal Reflux   (Consider location/radiation/quality/duration/timing/severity/associated sxs/prior Treatment) HPI Comments: 29 year old female states that 2-3 days ago a door struck her and the right hand. She is complaining of pain primarily to the wrist over the navicular. She is also requesting refill for a over-the-counter medicine Prilosec. She has a history of GERD   Past Medical History  Diagnosis Date  . Reflux    History reviewed. No pertinent past surgical history. No family history on file. History  Substance Use Topics  . Smoking status: Never Smoker   . Smokeless tobacco: Not on file  . Alcohol Use: No   OB History    Gravida Para Term Preterm AB TAB SAB Ectopic Multiple Living   1              Review of Systems  Constitutional: Negative.   Respiratory: Negative.   Gastrointestinal: Negative for vomiting.       Reflux symptoms and gas pains.  Genitourinary: Negative.   Musculoskeletal:       As per history of present illness  Neurological: Negative.     Allergies  Review of patient's allergies indicates no known allergies.  Home Medications   Prior to Admission medications   Medication Sig Start Date End Date Taking? Authorizing Provider  guaiFENesin (MUCINEX) 600 MG 12 hr tablet Take 1 tablet (600 mg total) by mouth 2 (two) times daily. 06/15/14   Ivonne AndrewPeter Dammen, PA-C  omeprazole (PRILOSEC) 20 MG capsule Take 1 capsule (20 mg total) by mouth daily. 03/28/15   Hayden Rasmussenavid Fard Borunda, NP   BP 104/66 mmHg  Pulse 89  Temp(Src) 97.5 F (36.4 C) (Oral)  Resp 12  SpO2 98%  LMP 03/15/2015 Physical Exam  Constitutional: She is oriented to person, place, and time. She appears well-developed and well-nourished. No distress.  Neck: Normal range of motion. Neck supple.  Musculoskeletal:   Right hand and wrist evaluation reveals no apparent swelling, deformity or discoloration. There is limited range of motion when performing extension, flexion, radial and ulnar deviation. Most of the pain is located over the wrist and in particular over the navicular. Distal neurovascular motor Sentry is intact. Movement at digits are fully intact. Capillary refill is brisk.  Neurological: She is alert and oriented to person, place, and time. She exhibits normal muscle tone.  Skin: Skin is warm and dry.  Psychiatric: She has a normal mood and affect.  Nursing note and vitals reviewed.   ED Course  Procedures (including critical care time) Labs Review Labs Reviewed - No data to display  Imaging Review Dg Wrist Complete Right  03/28/2015   CLINICAL DATA:  Wrist pain after blunt trauma today.  EXAM: RIGHT WRIST - COMPLETE 3+ VIEW  COMPARISON:  None.  FINDINGS: There is no evidence of fracture or dislocation. There is no evidence of arthropathy or other focal bone abnormality. Soft tissues are unremarkable.  IMPRESSION: Normal exam.   Electronically Signed   By: Francene BoyersJames  Maxwell M.D.   On: 03/28/2015 21:38     MDM   1. Gastroesophageal reflux disease without esophagitis   2. Wrist sprain, left, initial encounter   3. Hand contusion, right, initial encounter    prilosec Rx Wear splint for 4-5 days RICE F/U with PCP as needed. If worse may  return  Hayden Rasmussen, NP 03/28/15 2147

## 2015-03-28 NOTE — ED Notes (Signed)
Order changed from wrist splint to thumb spica

## 2015-03-28 NOTE — Discharge Instructions (Signed)
Contusion A contusion is a deep bruise. Contusions happen when an injury causes bleeding under the skin. Signs of bruising include pain, puffiness (swelling), and discolored skin. The contusion may turn blue, purple, or yellow. HOME CARE   Put ice on the injured area.  Put ice in a plastic bag.  Place a towel between your skin and the bag.  Leave the ice on for 15-20 minutes, 03-04 times a day.  Only take medicine as told by your doctor.  Rest the injured area.  If possible, raise (elevate) the injured area to lessen puffiness. GET HELP RIGHT AWAY IF:   You have more bruising or puffiness.  You have pain that is getting worse.  Your puffiness or pain is not helped by medicine. MAKE SURE YOU:   Understand these instructions.  Will watch your condition.  Will get help right away if you are not doing well or get worse. Document Released: 06/03/2008 Document Revised: 03/09/2012 Document Reviewed: 10/21/2011 [facility] Patient Information 2015 [place], [place]. This information is not intended to replace advice given to you by your health care provider. Make sure you discuss any questions you have with your health care provider.  Gastroesophageal Reflux Disease, Adult Gastroesophageal reflux disease (GERD) happens when acid from your stomach flows up into the esophagus. When acid comes in contact with the esophagus, the acid causes soreness (inflammation) in the esophagus. Over time, GERD may create small holes (ulcers) in the lining of the esophagus. CAUSES   Increased body weight. This puts pressure on the stomach, making acid rise from the stomach into the esophagus.  Smoking. This increases acid production in the stomach.  Drinking alcohol. This causes decreased pressure in the lower esophageal sphincter (valve or ring of muscle between the esophagus and stomach), allowing acid from the stomach into the esophagus.  Late evening meals and a full stomach. This increases pressure  and acid production in the stomach.  A malformed lower esophageal sphincter. Sometimes, no cause is found. SYMPTOMS   Burning pain in the lower part of the mid-chest behind the breastbone and in the mid-stomach area. This may occur twice a week or more often.  Trouble swallowing.  Sore throat.  Dry cough.  Asthma-like symptoms including chest tightness, shortness of breath, or wheezing. DIAGNOSIS  Your caregiver may be able to diagnose GERD based on your symptoms. In some cases, X-rays and other tests may be done to check for complications or to check the condition of your stomach and esophagus. TREATMENT  Your caregiver may recommend over-the-counter or prescription medicines to help decrease acid production. Ask your caregiver before starting or adding any new medicines.  HOME CARE INSTRUCTIONS   Change the factors that you can control. Ask your caregiver for guidance concerning weight loss, quitting smoking, and alcohol consumption.  Avoid foods and drinks that make your symptoms worse, such as:  Caffeine or alcoholic drinks.  Chocolate.  Peppermint or mint flavorings.  Garlic and onions.  Spicy foods.  Citrus fruits, such as oranges, lemons, or limes.  Tomato-based foods such as sauce, chili, salsa, and pizza.  Fried and fatty foods.  Avoid lying down for the 3 hours prior to your bedtime or prior to taking a nap.  Eat small, frequent meals instead of large meals.  Wear loose-fitting clothing. Do not wear anything tight around your waist that causes pressure on your stomach.  Raise the head of your bed 6 to 8 inches with wood blocks to help you sleep. Extra pillows will  not help.  Only take over-the-counter or prescription medicines for pain, discomfort, or fever as directed by your caregiver.  Do not take aspirin, ibuprofen, or other nonsteroidal anti-inflammatory drugs (NSAIDs). SEEK IMMEDIATE MEDICAL CARE IF:   You have pain in your arms, neck, jaw, teeth,  or back.  Your pain increases or changes in intensity or duration.  You develop nausea, vomiting, or sweating (diaphoresis).  You develop shortness of breath, or you faint.  Your vomit is green, yellow, black, or looks like coffee grounds or blood.  Your stool is red, bloody, or black. These symptoms could be signs of other problems, such as heart disease, gastric bleeding, or esophageal bleeding. MAKE SURE YOU:   Understand these instructions.  Will watch your condition.  Will get help right away if you are not doing well or get worse. Document Released: 09/25/2005 Document Revised: 03/09/2012 Document Reviewed: 07/05/2011 [facility] Patient Information 2015 [place]. This information is not intended to replace advice given to you by your health care provider. Make sure you discuss any questions you have with your health care provider.  Hand Contusion  A hand contusion is a deep bruise to the hand. Contusions happen when an injury causes bleeding under the skin. Signs of bruising include pain, puffiness (swelling), and discolored skin. The contusion may turn blue, purple, or yellow. HOME CARE  Put ice on the injured area.  Put ice in a plastic bag.  Place a towel between your skin and the bag.  Leave the ice on for 15-20 minutes, 03-04 times a day.  Only take medicines as told by your doctor.  Use an elastic wrap only as told. You may remove the wrap for sleeping, showering, and bathing. Take the wrap off if you lose feeling (have numbness) in your fingers, or they turn blue or cold. Put the wrap on more loosely.  Keep the hand raised (elevated) with pillows.  Avoid using your hand too much if it is painful. GET HELP RIGHT AWAY IF:   You have more redness, puffiness, or pain in your hand.  Your puffiness or pain does not get better with medicine.  You lose feeling in your hand, or you cannot move your fingers.  Your hand turns cold or blue.  You have pain when  you move your fingers.  Your hand feels warm.  Your contusion does not get better in 2 days. MAKE SURE YOU:   Understand these instructions.  Will watch this condition.  Will get help right away if you are not doing well or you get worse. Document Released: 06/03/2008 Document Revised: 05/02/2014 Document Reviewed: 06/08/2012 [facility] Patient Information 2015 [place], [place]. This information is not intended to replace advice given to you by your health care provider. Make sure you discuss any questions you have with your health care provider.

## 2015-04-12 DIAGNOSIS — N979 Female infertility, unspecified: Secondary | ICD-10-CM | POA: Diagnosis not present

## 2015-05-09 ENCOUNTER — Emergency Department (HOSPITAL_COMMUNITY)
Admission: EM | Admit: 2015-05-09 | Discharge: 2015-05-09 | Disposition: A | Discharge status: Home / Self Care | Payer: Medicare Other | Attending: Emergency Medicine | Admitting: Emergency Medicine

## 2015-05-09 ENCOUNTER — Encounter (HOSPITAL_COMMUNITY): Payer: Self-pay

## 2015-05-09 DIAGNOSIS — Z3202 Encounter for pregnancy test, result negative: Secondary | ICD-10-CM | POA: Insufficient documentation

## 2015-05-09 DIAGNOSIS — R350 Frequency of micturition: Secondary | ICD-10-CM | POA: Diagnosis not present

## 2015-05-09 DIAGNOSIS — Z7982 Long term (current) use of aspirin: Secondary | ICD-10-CM | POA: Diagnosis not present

## 2015-05-09 DIAGNOSIS — R300 Dysuria: Secondary | ICD-10-CM | POA: Insufficient documentation

## 2015-05-09 DIAGNOSIS — N898 Other specified noninflammatory disorders of vagina: Secondary | ICD-10-CM | POA: Insufficient documentation

## 2015-05-09 DIAGNOSIS — Z79899 Other long term (current) drug therapy: Secondary | ICD-10-CM | POA: Diagnosis not present

## 2015-05-09 DIAGNOSIS — K219 Gastro-esophageal reflux disease without esophagitis: Secondary | ICD-10-CM | POA: Insufficient documentation

## 2015-05-09 LAB — URINALYSIS, ROUTINE W REFLEX MICROSCOPIC
Bilirubin Urine: NEGATIVE
Glucose, UA: NEGATIVE mg/dL
Ketones, ur: NEGATIVE mg/dL
Leukocytes, UA: NEGATIVE
NITRITE: NEGATIVE
PH: 6.5 (ref 5.0–8.0)
Protein, ur: NEGATIVE mg/dL
SPECIFIC GRAVITY, URINE: 1.027 (ref 1.005–1.030)
Urobilinogen, UA: 1 mg/dL (ref 0.0–1.0)

## 2015-05-09 LAB — WET PREP, GENITAL
Trich, Wet Prep: NONE SEEN
YEAST WET PREP: NONE SEEN

## 2015-05-09 LAB — URINE MICROSCOPIC-ADD ON

## 2015-05-09 LAB — PREGNANCY, URINE: Preg Test, Ur: NEGATIVE

## 2015-05-09 NOTE — ED Provider Notes (Signed)
CSN: [id number]     Arrival date & time 05/09/15  1900 History   First MD Initiated Contact with Patient 05/09/15 1915     Chief Complaint  Patient presents with  . Dysuria     (Consider location/radiation/quality/duration/timing/severity/associated sxs/prior Treatment) HPI Comments: 29 year old female nonsmoker presents with urinary symptoms intermittent for one month and urinary odor. No current flank pain, no vomiting or fevers. Patient unsure if any vaginal discharge. No new sexual partners.  Patient is a 29 y.o. female presenting with dysuria. The history is provided by the patient.  Dysuria Associated symptoms: no abdominal pain, no fever, no flank pain and no vomiting     Past Medical History  Diagnosis Date  . Reflux    History reviewed. No pertinent past surgical history. History reviewed. No pertinent family history. History  Substance Use Topics  . Smoking status: Never Smoker   . Smokeless tobacco: Not on file  . Alcohol Use: No   OB History    Gravida Para Term Preterm AB TAB SAB Ectopic Multiple Living   1              Review of Systems  Constitutional: Negative for fever and chills.  HENT: Negative for congestion.   Eyes: Negative for visual disturbance.  Respiratory: Negative for shortness of breath.   Cardiovascular: Negative for chest pain.  Gastrointestinal: Negative for vomiting and abdominal pain.  Genitourinary: Positive for dysuria and frequency. Negative for flank pain.  Musculoskeletal: Negative for back pain, neck pain and neck stiffness.  Skin: Negative for rash.  Neurological: Negative for light-headedness and headaches.      Allergies  Review of patient's allergies indicates no known allergies.  Home Medications   Prior to Admission medications   Medication Sig Start Date End Date Taking? Authorizing Provider  aspirin EC 81 MG tablet Take 81 mg by mouth every 4 (four) hours as needed for mild pain.   Yes Historical Provider, MD   guaiFENesin (MUCINEX) 600 MG 12 hr tablet Take 1 tablet (600 mg total) by mouth 2 (two) times daily. Patient not taking: Reported on 05/09/2015 06/15/14   Ivonne AndrewPeter Dammen, PA-C  omeprazole (PRILOSEC) 20 MG capsule Take 1 capsule (20 mg total) by mouth daily. Patient not taking: Reported on 05/09/2015 03/28/15   Hayden Rasmussenavid Mabe, NP   BP 132/73 mmHg  Pulse 99  Temp(Src) 97.6 F (36.4 C) (Oral)  Resp 18  SpO2 100%  LMP 04/23/2015 Physical Exam  Constitutional: She is oriented to person, place, and time. She appears well-developed and well-nourished.  HENT:  Head: Normocephalic and atraumatic.  Eyes: Conjunctivae are normal. Right eye exhibits no discharge. Left eye exhibits no discharge.  Neck: Normal range of motion. Neck supple. No tracheal deviation present.  Cardiovascular: Normal rate and regular rhythm.   Pulmonary/Chest: Effort normal and breath sounds normal.  Abdominal: Soft. She exhibits no distension. There is tenderness (mild suprapubic). There is no guarding.  Genitourinary:  Mild discharge no cervical motion tenderness  Musculoskeletal: She exhibits no edema.  Neurological: She is alert and oriented to person, place, and time.  Skin: Skin is warm. No rash noted.  Psychiatric: She has a normal mood and affect.  Nursing note and vitals reviewed.   ED Course  Procedures (including critical care time) Labs Review Labs Reviewed  WET PREP, GENITAL - Abnormal; Notable for the following:    Clue Cells Wet Prep HPF POC RARE (*)    WBC, Wet Prep HPF POC RARE (*)  All other components within normal limits  URINALYSIS, ROUTINE W REFLEX MICROSCOPIC - Abnormal; Notable for the following:    APPearance CLOUDY (*)    Hgb urine dipstick TRACE (*)    All other components within normal limits  URINE MICROSCOPIC-ADD ON - Abnormal; Notable for the following:    Squamous Epithelial / LPF MANY (*)    All other components within normal limits  PREGNANCY, URINE  GC/CHLAMYDIA PROBE AMP ([facility])    Imaging Review No results found.   EKG Interpretation None      MDM   Final diagnoses:  Dysuria   Clinical concern for urine infection, urinalysis unremarkable, pelvic exam done, mild discharge. Discussed follow-up for culture as outpatient and recheck with women's.  Results and differential diagnosis were discussed with the patient/parent/guardian. Close follow up outpatient was discussed, comfortable with the plan.   Medications - No data to display  Filed Vitals:   05/09/15 1907  BP: 132/73  Pulse: 99  Temp: 97.6 F (36.4 C)  TempSrc: Oral  Resp: 18  SpO2: 100%    Final diagnoses:  Dysuria        Blane OharaJoshua Iliany Losier, MD 05/09/15 2221

## 2015-05-09 NOTE — ED Notes (Signed)
Pt had urinary symptoms x 1 month. Urine color changes and urinary odor.  No fever.

## 2015-05-09 NOTE — Discharge Instructions (Signed)
If you were given medicines take as directed.  If you are on coumadin or contraceptives realize their levels and effectiveness is altered by many different medicines.  If you have any reaction (rash, tongues swelling, other) to the medicines stop taking and see a physician.   Please follow up as directed and return to the ER or see a physician for new or worsening symptoms.  Thank you. Filed Vitals:   05/09/15 1907  BP: 132/73  Pulse: 99  Temp: 97.6 F (36.4 C)  TempSrc: Oral  Resp: 18  SpO2: 100%

## 2015-05-10 LAB — GC/CHLAMYDIA PROBE AMP (~~LOC~~) NOT AT ARMC
Chlamydia: NEGATIVE
Neisseria Gonorrhea: NEGATIVE

## 2015-07-08 ENCOUNTER — Encounter (HOSPITAL_COMMUNITY): Payer: Self-pay

## 2015-07-08 ENCOUNTER — Emergency Department (HOSPITAL_COMMUNITY)
Admission: EM | Admit: 2015-07-08 | Discharge: 2015-07-09 | Disposition: A | Discharge status: Home / Self Care | Payer: Medicare Other | Attending: Emergency Medicine | Admitting: Emergency Medicine

## 2015-07-08 DIAGNOSIS — M79621 Pain in right upper arm: Secondary | ICD-10-CM | POA: Diagnosis not present

## 2015-07-08 DIAGNOSIS — Z7982 Long term (current) use of aspirin: Secondary | ICD-10-CM | POA: Diagnosis not present

## 2015-07-08 DIAGNOSIS — Z8742 Personal history of other diseases of the female genital tract: Secondary | ICD-10-CM | POA: Insufficient documentation

## 2015-07-08 DIAGNOSIS — R590 Localized enlarged lymph nodes: Secondary | ICD-10-CM | POA: Diagnosis not present

## 2015-07-08 DIAGNOSIS — K219 Gastro-esophageal reflux disease without esophagitis: Secondary | ICD-10-CM | POA: Diagnosis not present

## 2015-07-08 HISTORY — DX: Unspecified ovarian cyst, unspecified side: N83.209

## 2015-07-08 MED ORDER — LIDOCAINE-EPINEPHRINE (PF) 2 %-1:200000 IJ SOLN
20.0000 mL | Freq: Once | INTRAMUSCULAR | Status: DC
  Filled 2015-07-08: qty 20

## 2015-07-08 NOTE — ED Notes (Signed)
Patient reports she has a cyst under her right arm that has been there for years, but the pain has gradually worsened and is not relieved with OTC pain relievers.

## 2015-07-09 MED ORDER — TRAMADOL HCL 50 MG PO TABS: 50.0000 mg | ORAL_TABLET | Freq: Four times a day (QID) | ORAL | Status: DC | PRN

## 2015-07-09 NOTE — Discharge Instructions (Signed)
Read the information below.  Use the prescribed medication as directed.  Please discuss all new medications with your pharmacist.  You may return to the Emergency Department at any time for worsening condition or any new symptoms that concern you.     If you develop redness, swelling, pus draining from the wound, or fevers greater than 100.4, return to the ER immediately for a recheck.   °

## 2015-07-09 NOTE — ED Provider Notes (Signed)
CSN: [id number]     Arrival date & time 07/08/15  2227 History   First MD Initiated Contact with Patient 07/08/15 2322     Chief Complaint  Patient presents with  . Abscess     (Consider location/radiation/quality/duration/timing/severity/associated sxs/prior Treatment) HPI   Pt with hx lipoma in right axilla x many years p/w area of tenderness adjacent to the lipoma.  States this flares up and causes her pain about once a year.  Pain is present with pressure and palpation. Has been seen in outside ED and was given tramadol, states it resolved after a few days.  Has never had the area drain on its own or be cut open.  Denies any fever, chills, CP, SOB, weakness or numbness of the right arm, breast pain or mass, skin infections, injury to the area.  She has never had her PCP look at the area.    Past Medical History  Diagnosis Date  . Reflux   . Ovarian cyst    History reviewed. No pertinent past surgical history. No family history on file. History  Substance Use Topics  . Smoking status: Never Smoker   . Smokeless tobacco: Not on file  . Alcohol Use: No   OB History    Gravida Para Term Preterm AB TAB SAB Ectopic Multiple Living   1              Review of Systems  All other systems reviewed and are negative.     Allergies  Review of patient's allergies indicates no known allergies.  Home Medications   Prior to Admission medications   Medication Sig Start Date End Date Taking? Authorizing Provider  Aspirin-Salicylamide-Caffeine (BC HEADACHE POWDER PO) Take 1 packet by mouth every 6 (six) hours as needed (for pain.).   Yes Historical Provider, MD  aspirin EC 81 MG tablet Take 81 mg by mouth daily.     Historical Provider, MD  omeprazole (PRILOSEC) 20 MG capsule Take 1 capsule (20 mg total) by mouth daily. Patient not taking: Reported on 05/09/2015 03/28/15   Hayden Rasmussenavid Mabe, NP  traMADol (ULTRAM) 50 MG tablet Take 1 tablet (50 mg total) by mouth every 6 (six) hours as needed  for moderate pain or severe pain. 07/09/15   Trixie DredgeEmily Lalonnie Shaffer, PA-C   BP 125/71 mmHg  Pulse 86  Temp(Src) 98.9 F (37.2 C) (Oral)  Resp 18  Ht 5' (1.524 m)  Wt 191 lb (86.637 kg)  BMI 37.30 kg/m2  SpO2 100%  LMP 06/25/2015 (Exact Date) Physical Exam  Constitutional: She appears well-developed and well-nourished. No distress.  HENT:  Head: Normocephalic and atraumatic.  Neck: Neck supple.  Pulmonary/Chest: Effort normal.  Lymphadenopathy:    She has no cervical adenopathy.       Right: No inguinal, no supraclavicular and no epitrochlear adenopathy present.       Left: No inguinal and no supraclavicular adenopathy present.  Right axilla with soft lipoma without overlying skin changes.  Superior to this there is a tender nodule, also without skin changes.  No fluctuance.  No erythema, edema, warmth, discharge, or tenderness   Neurological: She is alert.  Skin: She is not diaphoretic.  Nursing note and vitals reviewed.   ED Course  Procedures (including critical care time) Labs Review Labs Reviewed - No data to display  Imaging Review No results found.   EKG Interpretation None       EMERGENCY DEPARTMENT US SOFT TISSUE INTERPRETATION "Study: Limited Ultrasound of the noted body part  in comments below"  INDICATIONS: Pain Multiple views of the body part are obtained with a multi-frequency linear probe  PERFORMED BY:  Myself  IMAGES ARCHIVED?: Yes  SIDE:Right   BODY PART:Axilla  FINDINGS: No abcess noted and Cellulitis absent  LIMITATIONS:  Body Habitus  INTERPRETATION:  No abcess noted  COMMENT:  Suspect tenderness associated with lymph node.    MDM   Final diagnoses:  Axillary pain, right  Lymphadenopathy, axillary    Afebrile, nontoxic patient with right axillary tenderness clinically associated with lymph node.  No cellulitis.  Doubt abscess.  bedside US without apparent drainable collection.  No contitutional symptoms.  D/C home with tramadol, recommended  follow up with PCP.  Discussed result, findings, treatment, and follow up  with patient.  Pt given return precautions.  Pt verbalizes understanding and agrees with plan.         Trixie Dredge, PA-C 07/09/15 0100  Elwin Mocha, MD 07/09/15 [phone]

## 2015-07-20 DIAGNOSIS — N946 Dysmenorrhea, unspecified: Secondary | ICD-10-CM | POA: Diagnosis not present

## 2015-07-20 DIAGNOSIS — E282 Polycystic ovarian syndrome: Secondary | ICD-10-CM | POA: Diagnosis not present

## 2015-07-20 DIAGNOSIS — H547 Unspecified visual loss: Secondary | ICD-10-CM | POA: Diagnosis not present

## 2015-07-20 DIAGNOSIS — N943 Premenstrual tension syndrome: Secondary | ICD-10-CM | POA: Diagnosis not present

## 2015-09-05 ENCOUNTER — Other Ambulatory Visit: Payer: Self-pay | Admitting: Surgery

## 2015-09-05 DIAGNOSIS — R2232 Localized swelling, mass and lump, left upper limb: Secondary | ICD-10-CM | POA: Diagnosis not present

## 2015-10-09 ENCOUNTER — Encounter (HOSPITAL_COMMUNITY): Payer: Self-pay | Admitting: *Deleted

## 2015-10-09 ENCOUNTER — Emergency Department (HOSPITAL_COMMUNITY)
Admission: EM | Admit: 2015-10-09 | Discharge: 2015-10-09 | Disposition: A | Discharge status: Home / Self Care | Payer: Medicare Other | Attending: Emergency Medicine | Admitting: Emergency Medicine

## 2015-10-09 DIAGNOSIS — K219 Gastro-esophageal reflux disease without esophagitis: Secondary | ICD-10-CM | POA: Diagnosis not present

## 2015-10-09 DIAGNOSIS — Z8742 Personal history of other diseases of the female genital tract: Secondary | ICD-10-CM | POA: Insufficient documentation

## 2015-10-09 DIAGNOSIS — M25562 Pain in left knee: Secondary | ICD-10-CM | POA: Insufficient documentation

## 2015-10-09 DIAGNOSIS — Z7982 Long term (current) use of aspirin: Secondary | ICD-10-CM | POA: Insufficient documentation

## 2015-10-09 MED ORDER — IBUPROFEN 600 MG PO TABS
600.0000 mg | ORAL_TABLET | Freq: Four times a day (QID) | ORAL | Status: DC | PRN
Start: 2015-10-09 — End: 2015-11-26

## 2015-10-09 MED ORDER — HYDROCODONE-ACETAMINOPHEN 5-325 MG PO TABS: 1.0000 | ORAL_TABLET | Freq: Four times a day (QID) | ORAL | Status: DC | PRN

## 2015-10-09 NOTE — ED Provider Notes (Signed)
CSN: [id number]     Arrival date & time 10/09/15  1641 History  By signing my name below, I, Soijett Blue, attest that this documentation has been prepared under the direction and in the presence of Roxy Horseman, PA-C Electronically Signed: Soijett Blue, ED Scribe. 10/09/2015. 6:39 PM.   Chief Complaint  Patient presents with  . Knee Pain      The history is provided by the patient. No language interpreter was used.    Mariah Rosales is a 29 y.o. female who presents to the Emergency Department complaining of left knee pain onset today. She notes that she was at work when the pain began. She reports that the pain radiates to her left foot. She notes that she works in [employer] and she lifts a lot of heavy objects. She reports that she is able to walk on it but there is pain with walking up and and down steps. She denies any injury/trauma to the left knee. She notes that she has associated symptoms of left knee swelling. She has not tried any medications for the relief of her symptoms. She denies color change, wound, rash, and any other symptoms.    Past Medical History  Diagnosis Date  . Reflux   . Ovarian cyst    History reviewed. No pertinent past surgical history. History reviewed. No pertinent family history. Social History  Substance Use Topics  . Smoking status: Never Smoker   . Smokeless tobacco: None  . Alcohol Use: No   OB History    Gravida Para Term Preterm AB TAB SAB Ectopic Multiple Living   1              Review of Systems  Musculoskeletal: Positive for joint swelling and arthralgias (left knee). Negative for gait problem.  Skin: Negative for color change, rash and wound.      Allergies  Review of patient's allergies indicates no known allergies.  Home Medications   Prior to Admission medications   Medication Sig Start Date End Date Taking? Authorizing Provider  aspirin EC 81 MG tablet Take 81 mg by mouth daily.     Historical Provider, MD   Aspirin-Salicylamide-Caffeine (BC HEADACHE POWDER PO) Take 1 packet by mouth every 6 (six) hours as needed (for pain.).    Historical Provider, MD  omeprazole (PRILOSEC) 20 MG capsule Take 1 capsule (20 mg total) by mouth daily. Patient not taking: Reported on 05/09/2015 03/28/15   Hayden Rasmussen, NP  traMADol (ULTRAM) 50 MG tablet Take 1 tablet (50 mg total) by mouth every 6 (six) hours as needed for moderate pain or severe pain. 07/09/15   Trixie Dredge, PA-C   BP 105/88 mmHg  Pulse 86  Temp(Src) 97.7 F (36.5 C) (Oral)  Resp 18  SpO2 100%  LMP 09/15/2015 Physical Exam  Constitutional: She is oriented to person, place, and time. She appears well-developed and well-nourished. No distress.  HENT:  Head: Normocephalic and atraumatic.  Eyes: EOM are normal.  Neck: Neck supple.  Cardiovascular: Normal rate.   Pulmonary/Chest: Effort normal. No respiratory distress.  Musculoskeletal: Normal range of motion.       Left knee: She exhibits swelling. She exhibits normal range of motion, no deformity and no erythema. Tenderness found.  Mild TTP and swelling at the infrapatellar bursa. No bony abnormality or deformity. ROM and strength is 5/5. Pt is ambulatory. No erythema. No evidence of septic joint or DVT.  Neurological: She is alert and oriented to person, place, and time.  Skin: Skin is warm and dry.  Psychiatric: She has a normal mood and affect. Her behavior is normal.  Nursing note and vitals reviewed.   ED Course  Procedures (including critical care time) DIAGNOSTIC STUDIES: Oxygen Saturation is 100% on RA, nl by my interpretation.    COORDINATION OF CARE: 6:35 PM Discussed treatment plan with pt at bedside which includes knee brace, pain medication Rx, anti-inflammatory Rx, f/u and referral to orthopedist PRN if the pain persists, RICE and pt agreed to plan.       MDM   Final diagnoses:  Knee pain, acute, left    Patient with left knee pain. Pain seems to be along the  inferolateral aspect.  No bony abnormality. Normal ROM and strength.  No sign of septic joint, abscess, or DVT.  No injuries.  Will give knee sleeve and recommend ortho follow-up.  Patient ambulates without difficulty.  No imaging at this time.  I, Zahria Ding, personally performed the services described in this documentation. All medical record entries made by the scribe were at my direction and in my presence.  I have reviewed the chart and discharge instructions and agree that the record reflects my personal performance and is accurate and complete. Krayton Wortley.  10/09/2015. 6:46 PM.      Roxy Horseman, PA-C 10/09/15 1847  Gerhard Munch, MD 10/09/15 [phone]

## 2015-10-09 NOTE — Discharge Instructions (Signed)

## 2015-10-09 NOTE — ED Notes (Signed)
Pt reports onset of left knee pain today while at work. Denies injury. Ambulatory at triage.

## 2015-11-26 ENCOUNTER — Emergency Department (HOSPITAL_COMMUNITY): Payer: Medicare Other

## 2015-11-26 ENCOUNTER — Emergency Department (HOSPITAL_COMMUNITY)
Admission: EM | Admit: 2015-11-26 | Discharge: 2015-11-26 | Disposition: A | Discharge status: Home / Self Care | Payer: Medicare Other | Attending: Emergency Medicine | Admitting: Emergency Medicine

## 2015-11-26 ENCOUNTER — Encounter (HOSPITAL_COMMUNITY): Payer: Self-pay | Admitting: *Deleted

## 2015-11-26 DIAGNOSIS — Z8719 Personal history of other diseases of the digestive system: Secondary | ICD-10-CM | POA: Diagnosis not present

## 2015-11-26 DIAGNOSIS — M25562 Pain in left knee: Secondary | ICD-10-CM

## 2015-11-26 DIAGNOSIS — Z8742 Personal history of other diseases of the female genital tract: Secondary | ICD-10-CM | POA: Diagnosis not present

## 2015-11-26 DIAGNOSIS — Z7982 Long term (current) use of aspirin: Secondary | ICD-10-CM | POA: Diagnosis not present

## 2015-11-26 MED ORDER — DICLOFENAC SODIUM 50 MG PO TBEC: 50.0000 mg | DELAYED_RELEASE_TABLET | Freq: Two times a day (BID) | ORAL | Status: DC

## 2015-11-26 NOTE — ED Notes (Signed)
Patient presents stating she has been having knee pain for more than a month.  Left knee tender to touch - states it hurts worse when she stands.  Has been seen here for the same

## 2015-11-26 NOTE — Discharge Instructions (Signed)
Call Dr. Ophelia CharterYates' office for a follow up appointment.

## 2015-11-26 NOTE — ED Provider Notes (Signed)
CSN: [id number]     Arrival date & time 11/26/15  2043 History  By signing my name below, I, Mariah Rosales, attest that this documentation has been prepared under the direction and in the presence of Kerrie Buffalo, NP. Electronically Signed: Evon Rosales, ED Scribe. 11/26/2015. 11:19 PM.    Chief Complaint  Patient presents with  . Knee Pain   Patient is a 29 y.o. female presenting with knee pain. The history is provided by the patient. No language interpreter was used.  Knee Pain Location:  Knee Time since incident:  1 month Injury: no   Knee location:  L knee Pain details:    Radiates to:  Does not radiate   Severity:  Mild   Duration:  1 month   Progression:  Unchanged Dislocation: no   Foreign body present:  No foreign bodies Prior injury to area:  No Relieved by:  NSAIDs Worsened by:  Bearing weight Associated symptoms: no numbness and no tingling    HPI Comments: Mariah Rosales is a 29 y.o. female who presents to the Emergency Department complaining of left knee pain onset 1 month prior. Pt states she has tried taking anti anti inflammatory  medications with some some relief. She states that the pain is worse when ambulating. Pt states that the pain is also worse in the morning.  Pt states that she was evaluated for same knee pain about 1 month prior. Pt denies injury or trauma to the knee. Pt doesn't report numbness or tingling . Patient was given a knee sleeve but reports that it kept sliding off when sh tried to wear it at work.   Past Medical History  Diagnosis Date  . Reflux   . Ovarian cyst    History reviewed. No pertinent past surgical history. No family history on file. Social History  Substance Use Topics  . Smoking status: Never Smoker   . Smokeless tobacco: Never Used  . Alcohol Use: No   OB History    Gravida Para Term Preterm AB TAB SAB Ectopic Multiple Living   1              Review of Systems  Musculoskeletal:       Left knee pain  All  other systems reviewed and are negative.     Allergies  Review of patient's allergies indicates no known allergies.  Home Medications   Prior to Admission medications   Medication Sig Start Date End Date Taking? Authorizing Provider  aspirin EC 81 MG tablet Take 81 mg by mouth daily.     Historical Provider, MD  Aspirin-Salicylamide-Caffeine (BC HEADACHE POWDER PO) Take 1 packet by mouth every 6 (six) hours as needed (for pain.).    Historical Provider, MD  diclofenac (VOLTAREN) 50 MG EC tablet Take 1 tablet (50 mg total) by mouth 2 (two) times daily. 11/26/15   Dilana Mcphie Orlene Och, NP  HYDROcodone-acetaminophen (NORCO/VICODIN) 5-325 MG tablet Take 1-2 tablets by mouth every 6 (six) hours as needed for severe pain. 10/09/15   Roxy Horseman, PA-C  omeprazole (PRILOSEC) 20 MG capsule Take 1 capsule (20 mg total) by mouth daily. Patient not taking: Reported on 05/09/2015 03/28/15   Hayden Rasmussen, NP  traMADol (ULTRAM) 50 MG tablet Take 1 tablet (50 mg total) by mouth every 6 (six) hours as needed for moderate pain or severe pain. 07/09/15   Trixie Dredge, PA-C   BP 123/78 mmHg  Pulse 85  Temp(Src) 98 F (36.7 C) (Oral)  Resp 18  SpO2 100%  LMP 11/13/2015   Physical Exam  Constitutional: She is oriented to person, place, and time. She appears well-developed and well-nourished. No distress.  HENT:  Head: Normocephalic and atraumatic.  Eyes: Conjunctivae and EOM are normal.  Neck: Neck supple. No tracheal deviation present.  Cardiovascular: Normal rate.   Pulses:      Dorsalis pedis pulses are 2+ on the right side, and 2+ on the left side.       Posterior tibial pulses are 2+ on the right side, and 2+ on the left side.  Pulmonary/Chest: Effort normal. No respiratory distress.  Musculoskeletal: Normal range of motion.  No abnormal patella movement, no high rising patella movement. Full ROM of knee without difficulty. slightly tender on posterior aspect left knee.   Neurological: She is alert and  oriented to person, place, and time.  Skin: Skin is warm and dry.  Psychiatric: She has a normal mood and affect. Her behavior is normal.  Nursing note and vitals reviewed.   ED Course  Procedures (including critical care time) DIAGNOSTIC STUDIES: Oxygen Saturation is 100% on RA, normal by my interpretation.    COORDINATION OF CARE: 9:26 PM-Discussed treatment plan with pt at bedside and pt agreed to plan.    Imaging Review Dg Knee Complete 4 Views Left  11/26/2015  CLINICAL DATA:  Nontraumatic left knee pain of 1 month duration. EXAM: LEFT KNEE - COMPLETE 4+ VIEW COMPARISON:  None. FINDINGS: Negative for fracture or other acute bony abnormality. There is no bone lesion or bony destruction. No significant arthritic changes are evident. IMPRESSION: Negative. Electronically Signed   By: Ellery Plunkaniel R Mitchell M.D.   On: 11/26/2015 22:09    MDM  29 y.o. female with left knee pain stable for d/c without focal neuro deficits. Ambulatory without difficulty. Ace wrap applied, ice, NSAIDS and follow up with ortho. Discussed with the patient and all questioned fully answered. She will follow up as discussed. She will return here if any problems arise.  Final diagnoses:  Knee pain, left   I personally performed the services described in this documentation, which was scribed in my presence. The recorded information has been reviewed and is accurate.      [address] StreetHope WynnedaleM Darragh Nay, NP 11/26/15 2321  Lavera Guiseana Duo Liu, MD 11/27/15 [phone]

## 2015-11-26 NOTE — ED Notes (Signed)
Patient sitting in the chair with knees bent.  States her left knee has been bothering her for over a month.  Left knee tender to touch with minimal swelling.

## 2015-11-26 NOTE — ED Notes (Signed)
Discharge instructions reviewed - voiced understanding 

## 2015-11-26 NOTE — ED Notes (Signed)
Ace wrap applied and ice pack given.

## 2015-12-04 DIAGNOSIS — N898 Other specified noninflammatory disorders of vagina: Secondary | ICD-10-CM | POA: Diagnosis not present

## 2015-12-04 DIAGNOSIS — N979 Female infertility, unspecified: Secondary | ICD-10-CM | POA: Diagnosis not present

## 2015-12-19 DIAGNOSIS — M25562 Pain in left knee: Secondary | ICD-10-CM | POA: Diagnosis not present

## 2015-12-21 DIAGNOSIS — E119 Type 2 diabetes mellitus without complications: Secondary | ICD-10-CM | POA: Diagnosis not present

## 2016-02-11 ENCOUNTER — Emergency Department (HOSPITAL_COMMUNITY)
Admission: EM | Admit: 2016-02-11 | Discharge: 2016-02-11 | Disposition: A | Discharge status: Home / Self Care | Payer: Medicare Other | Attending: Emergency Medicine | Admitting: Emergency Medicine

## 2016-02-11 ENCOUNTER — Encounter (HOSPITAL_COMMUNITY): Payer: Self-pay

## 2016-02-11 DIAGNOSIS — H6991 Unspecified Eustachian tube disorder, right ear: Secondary | ICD-10-CM | POA: Diagnosis not present

## 2016-02-11 DIAGNOSIS — Z7982 Long term (current) use of aspirin: Secondary | ICD-10-CM | POA: Diagnosis not present

## 2016-02-11 DIAGNOSIS — H6981 Other specified disorders of Eustachian tube, right ear: Secondary | ICD-10-CM

## 2016-02-11 DIAGNOSIS — Z791 Long term (current) use of non-steroidal anti-inflammatories (NSAID): Secondary | ICD-10-CM | POA: Insufficient documentation

## 2016-02-11 DIAGNOSIS — H6591 Unspecified nonsuppurative otitis media, right ear: Secondary | ICD-10-CM

## 2016-02-11 DIAGNOSIS — Z8742 Personal history of other diseases of the female genital tract: Secondary | ICD-10-CM | POA: Diagnosis not present

## 2016-02-11 DIAGNOSIS — Z8719 Personal history of other diseases of the digestive system: Secondary | ICD-10-CM | POA: Diagnosis not present

## 2016-02-11 DIAGNOSIS — H748X1 Other specified disorders of right middle ear and mastoid: Secondary | ICD-10-CM | POA: Diagnosis not present

## 2016-02-11 DIAGNOSIS — H9201 Otalgia, right ear: Secondary | ICD-10-CM | POA: Diagnosis present

## 2016-02-11 MED ORDER — IBUPROFEN 800 MG PO TABS: 800.0000 mg | ORAL_TABLET | Freq: Three times a day (TID) | ORAL | Status: DC

## 2016-02-11 MED ORDER — CETIRIZINE HCL 10 MG PO TABS: 10.0000 mg | ORAL_TABLET | Freq: Every day | ORAL | Status: DC

## 2016-02-11 NOTE — ED Notes (Signed)
Pt with sharp pain to right ear.  Off balance.  Started yesterday

## 2016-02-11 NOTE — Discharge Instructions (Signed)
Serous Otitis Media Serous otitis media is fluid in the middle ear space. This space contains the bones for hearing and air. Air in the middle ear space helps to transmit sound.  The air gets there through the eustachian tube. This tube goes from the back of the nose (nasopharynx) to the middle ear space. It keeps the pressure in the middle ear the same as the outside world. It also helps to drain fluid from the middle ear space. CAUSES  Serous otitis media occurs when the eustachian tube gets blocked. Blockage can come from:  Ear infections.  Colds and other upper respiratory infections.  Allergies.  Irritants such as cigarette smoke.  Sudden changes in air pressure (such as descending in an airplane).  Enlarged adenoids.  A mass in the nasopharynx. During colds and upper respiratory infections, the middle ear space can become temporarily filled with fluid. This can happen after an ear infection also. Once the infection clears, the fluid will generally drain out of the ear through the eustachian tube. If it does not, then serous otitis media occurs. SIGNS AND SYMPTOMS   Hearing loss.  A feeling of fullness in the ear, without pain.  Young children may not show any symptoms but may show slight behavioral changes, such as agitation, ear pulling, or crying. DIAGNOSIS  Serous otitis media is diagnosed by an ear exam. Tests may be done to check on the movement of the eardrum. Hearing exams may also be done. TREATMENT  The fluid most often goes away without treatment. If allergy is the cause, allergy treatment may be helpful. Fluid that persists for several months may require minor surgery. A small tube is placed in the eardrum to:  Drain the fluid.  Restore the air in the middle ear space. In certain situations, antibiotic medicines are used to avoid surgery. Surgery may be done to remove enlarged adenoids (if this is the cause). HOME CARE INSTRUCTIONS   Keep children away from  tobacco smoke.  Keep all follow-up visits as directed by your health care provider. SEEK MEDICAL CARE IF:   Your hearing is not better in 3 months.  Your hearing is worse.  You have ear pain.  You have drainage from the ear.  You have dizziness.  You have serous otitis media only in one ear or have any bleeding from your nose (epistaxis).  You notice a lump on your neck. MAKE SURE YOU:  Understand these instructions.   Will watch your condition.   Will get help right away if you are not doing well or get worse.    This information is not intended to replace advice given to you by your health care provider. Make sure you discuss any questions you have with your health care provider.   Document Released: 03/07/2004 Document Revised: 01/06/2015 Document Reviewed: 07/13/2013 Elsevier Interactive Patient Education 2016 [employer]  

## 2016-02-11 NOTE — ED Provider Notes (Signed)
History  By signing my name below, I, Karle Plumber, attest that this documentation has been prepared under the direction and in the presence of Cheri Fowler, PA-C. Electronically Signed: Karle Plumber, ED Scribe. 02/11/2016. 12:27 PM.  Chief Complaint  Patient presents with  . Otalgia   The history is provided by the patient and medical records. No language interpreter was used.    HPI Comments:  Mariah Rosales is a 30 y.o. obese female who presents to the Emergency Department complaining of intermittent sharp right ear pain that began yesterday. She reports associated right sided pressure in her head that began earlier today. Pt reports mild HA, mild decrease in hearing and slightly off-balance when standing. She states when she was younger she used to have fluid in her ears frequently. She has not taken anything to treat her symptoms. She denies modifying symptoms. She denies ear drainage, fever, chills, rhinorrhea, sore throat, watery eyes, tinnitus, syncope, AMS. She denies h/o HTN and states she is borderline diabetic. She denies any trauma or injury to the ears.  Past Medical History  Diagnosis Date  . Reflux   . Ovarian cyst    History reviewed. No pertinent past surgical history. History reviewed. No pertinent family history. Social History  Substance Use Topics  . Smoking status: Never Smoker   . Smokeless tobacco: Never Used  . Alcohol Use: No   OB History    Gravida Para Term Preterm AB TAB SAB Ectopic Multiple Living   1              Review of Systems  A complete 10 system review of systems was obtained and all systems are negative except as noted in the HPI and PMH.   Allergies  Review of patient's allergies indicates no known allergies.  Home Medications   Prior to Admission medications   Medication Sig Start Date End Date Taking? Authorizing Provider  aspirin EC 81 MG tablet Take 81 mg by mouth daily.     Historical Provider, MD   Aspirin-Salicylamide-Caffeine (BC HEADACHE POWDER PO) Take 1 packet by mouth every 6 (six) hours as needed (for pain.).    Historical Provider, MD  cetirizine (ZYRTEC) 10 MG tablet Take 1 tablet (10 mg total) by mouth daily. 02/11/16   Cheri Fowler, PA-C  diclofenac (VOLTAREN) 50 MG EC tablet Take 1 tablet (50 mg total) by mouth 2 (two) times daily. 11/26/15   Hope Orlene Och, NP  HYDROcodone-acetaminophen (NORCO/VICODIN) 5-325 MG tablet Take 1-2 tablets by mouth every 6 (six) hours as needed for severe pain. 10/09/15   Roxy Horseman, PA-C  ibuprofen (ADVIL,MOTRIN) 800 MG tablet Take 1 tablet (800 mg total) by mouth 3 (three) times daily. 02/11/16   Cheri Fowler, PA-C  omeprazole (PRILOSEC) 20 MG capsule Take 1 capsule (20 mg total) by mouth daily. Patient not taking: Reported on 05/09/2015 03/28/15   Hayden Rasmussen, NP  traMADol (ULTRAM) 50 MG tablet Take 1 tablet (50 mg total) by mouth every 6 (six) hours as needed for moderate pain or severe pain. 07/09/15   Trixie Dredge, PA-C   Triage Vitals: BP 114/77 mmHg  Pulse 88  Temp(Src) 98.3 F (36.8 C) (Oral)  Resp 16  SpO2 100%  LMP 02/01/2016 Physical Exam  Constitutional: She is oriented to person, place, and time. She appears well-developed and well-nourished.  Non-toxic appearance. She does not have a sickly appearance. She does not appear ill.  HENT:  Head: Normocephalic and atraumatic.  Right Ear: Hearing and ear  canal normal. There is tenderness (mild). No drainage or swelling. Tympanic membrane is not injected, not erythematous, not retracted and not bulging. A middle ear effusion is present.  Left Ear: Hearing, tympanic membrane, external ear and ear canal normal.  Mouth/Throat: Oropharynx is clear and moist.  Eyes: Conjunctivae are normal.  Neck: Normal range of motion. Neck supple.  Cardiovascular: Normal rate, regular rhythm and normal heart sounds.   No murmur heard. Pulmonary/Chest: Effort normal and breath sounds normal. No accessory muscle  usage or stridor. No respiratory distress. She has no wheezes. She has no rhonchi. She has no rales.  Abdominal: Soft. Bowel sounds are normal. She exhibits no distension. There is no tenderness.  Musculoskeletal: Normal range of motion.  Lymphadenopathy:    She has no cervical adenopathy.  Neurological: She is alert and oriented to person, place, and time. No cranial nerve deficit. Coordination normal.  Speech clear without dysarthria.  Gait normal.   Skin: Skin is warm and dry.  Psychiatric: She has a normal mood and affect. Her behavior is normal.    ED Course  Procedures (including critical care time) DIAGNOSTIC STUDIES: Oxygen Saturation is 100% on RA, normal by my interpretation.   COORDINATION OF CARE: 12:26 PM- Informed pt that there was no sign of infection. Will prescribe antihistamine and advised pt to take Ibuprofen or Tylenol. Will give referral to ENT for pt to follow up in a week for continued or worsening symptoms. Return precautions discussed. Pt verbalizes understanding and agrees to plan.  Medications - No data to display   MDM   Final diagnoses:  Middle ear effusion, right  Eustachian tube dysfunction, right    Patient presents with right middle ear effusion and eustachian tube dysfunction likely related to recent URI.  VSS, NAD.  No cranial nerve deficits.  Normal gait.  No systemic symptoms.  Plan to discharge home with zyrtec and ibuprofen with ENT follow up.  Discussed return precautions.  Patient agrees and  acknowledges the above plan for discharge.   I personally performed the services described in this documentation, which was scribed in my presence. The recorded information has been reviewed and is accurate.     Cheri Fowler, PA-C 02/11/16 1241  Nelva Nay, MD 02/13/16 [id number]

## 2016-03-05 ENCOUNTER — Encounter (HOSPITAL_COMMUNITY): Payer: Self-pay

## 2016-03-05 ENCOUNTER — Emergency Department (HOSPITAL_COMMUNITY)
Admission: EM | Admit: 2016-03-05 | Discharge: 2016-03-06 | Disposition: A | Discharge status: Home / Self Care | Payer: Medicare Other | Attending: Emergency Medicine | Admitting: Emergency Medicine

## 2016-03-05 DIAGNOSIS — N939 Abnormal uterine and vaginal bleeding, unspecified: Secondary | ICD-10-CM | POA: Diagnosis not present

## 2016-03-05 DIAGNOSIS — R079 Chest pain, unspecified: Secondary | ICD-10-CM | POA: Insufficient documentation

## 2016-03-05 DIAGNOSIS — R350 Frequency of micturition: Secondary | ICD-10-CM | POA: Insufficient documentation

## 2016-03-05 DIAGNOSIS — R109 Unspecified abdominal pain: Secondary | ICD-10-CM | POA: Diagnosis present

## 2016-03-05 NOTE — ED Notes (Signed)
Pt here with abdominal pain, vaginal bleeding and gastric reflux chest pain.  Pt is out of her reflux medications.  No diarrhea, has had frequent urination.  No fever

## 2016-03-05 NOTE — ED Notes (Signed)
Writer called for blood draws, no response 

## 2016-03-06 NOTE — ED Notes (Signed)
Writer called again for blood draws no reply

## 2016-03-17 ENCOUNTER — Emergency Department (HOSPITAL_COMMUNITY)
Admission: EM | Admit: 2016-03-17 | Discharge: 2016-03-17 | Disposition: A | Discharge status: Home / Self Care | Payer: Medicare Other | Attending: Emergency Medicine | Admitting: Emergency Medicine

## 2016-03-17 ENCOUNTER — Emergency Department (HOSPITAL_COMMUNITY): Payer: Medicare Other

## 2016-03-17 ENCOUNTER — Encounter (HOSPITAL_COMMUNITY): Payer: Self-pay | Admitting: Emergency Medicine

## 2016-03-17 DIAGNOSIS — R079 Chest pain, unspecified: Secondary | ICD-10-CM | POA: Insufficient documentation

## 2016-03-17 DIAGNOSIS — R358 Other polyuria: Secondary | ICD-10-CM | POA: Insufficient documentation

## 2016-03-17 DIAGNOSIS — R0789 Other chest pain: Secondary | ICD-10-CM | POA: Diagnosis not present

## 2016-03-17 NOTE — ED Notes (Signed)
Pt called twice to update pt vital signs with no answer.  

## 2016-03-17 NOTE — ED Notes (Signed)
Pt c/o left sided chest pain onset 3-4 days ago. Pt denies any other symptoms. Pt reports pain increases with laying down and decreases when sitting up. Pt also reports increase urination for a while.

## 2016-03-23 LAB — HM PAP SMEAR: HM PAP: NORMAL

## 2016-03-24 DIAGNOSIS — F419 Anxiety disorder, unspecified: Secondary | ICD-10-CM | POA: Insufficient documentation

## 2016-03-24 DIAGNOSIS — F329 Major depressive disorder, single episode, unspecified: Secondary | ICD-10-CM | POA: Insufficient documentation

## 2016-03-24 DIAGNOSIS — R269 Unspecified abnormalities of gait and mobility: Secondary | ICD-10-CM | POA: Insufficient documentation

## 2016-03-24 DIAGNOSIS — M79622 Pain in left upper arm: Secondary | ICD-10-CM | POA: Insufficient documentation

## 2016-03-24 DIAGNOSIS — R079 Chest pain, unspecified: Secondary | ICD-10-CM | POA: Diagnosis not present

## 2016-03-24 DIAGNOSIS — M79602 Pain in left arm: Secondary | ICD-10-CM | POA: Diagnosis present

## 2016-03-24 DIAGNOSIS — Z8742 Personal history of other diseases of the female genital tract: Secondary | ICD-10-CM | POA: Insufficient documentation

## 2016-03-24 DIAGNOSIS — Z8719 Personal history of other diseases of the digestive system: Secondary | ICD-10-CM | POA: Diagnosis not present

## 2016-03-24 DIAGNOSIS — M79603 Pain in arm, unspecified: Secondary | ICD-10-CM

## 2016-03-24 DIAGNOSIS — R0789 Other chest pain: Secondary | ICD-10-CM | POA: Diagnosis not present

## 2016-03-25 ENCOUNTER — Encounter (HOSPITAL_COMMUNITY): Payer: Self-pay | Admitting: Emergency Medicine

## 2016-03-25 ENCOUNTER — Emergency Department (HOSPITAL_COMMUNITY): Payer: Medicare Other

## 2016-03-25 ENCOUNTER — Emergency Department (HOSPITAL_COMMUNITY)
Admission: EM | Admit: 2016-03-25 | Discharge: 2016-03-25 | Disposition: A | Discharge status: Home / Self Care | Payer: Medicare Other | Attending: Emergency Medicine | Admitting: Emergency Medicine

## 2016-03-25 ENCOUNTER — Emergency Department (HOSPITAL_COMMUNITY)
Admission: EM | Admit: 2016-03-25 | Discharge: 2016-03-25 | Disposition: A | Discharge status: Home / Self Care | Payer: Medicare Other | Source: Home / Self Care

## 2016-03-25 DIAGNOSIS — R0789 Other chest pain: Secondary | ICD-10-CM | POA: Diagnosis not present

## 2016-03-25 DIAGNOSIS — M79622 Pain in left upper arm: Secondary | ICD-10-CM | POA: Diagnosis not present

## 2016-03-25 DIAGNOSIS — R079 Chest pain, unspecified: Secondary | ICD-10-CM | POA: Diagnosis not present

## 2016-03-25 DIAGNOSIS — M79602 Pain in left arm: Secondary | ICD-10-CM

## 2016-03-25 LAB — CBC
HEMATOCRIT: 37.6 % (ref 36.0–46.0)
HEMOGLOBIN: 12.8 g/dL (ref 12.0–15.0)
MCH: 27.5 pg (ref 26.0–34.0)
MCHC: 34 g/dL (ref 30.0–36.0)
MCV: 80.7 fL (ref 78.0–100.0)
Platelets: 235 10*3/uL (ref 150–400)
RBC: 4.66 MIL/uL (ref 3.87–5.11)
RDW: 13 % (ref 11.5–15.5)
WBC: 7 10*3/uL (ref 4.0–10.5)

## 2016-03-25 LAB — BASIC METABOLIC PANEL
ANION GAP: 9 (ref 5–15)
BUN: 14 mg/dL (ref 6–20)
CALCIUM: 9.2 mg/dL (ref 8.9–10.3)
CO2: 24 mmol/L (ref 22–32)
Chloride: 105 mmol/L (ref 101–111)
Creatinine, Ser: 0.85 mg/dL (ref 0.44–1.00)
GFR calc non Af Amer: >60 mL/min (ref >60)
GLUCOSE: 122 mg/dL — AB (ref 65–99)
POTASSIUM: 3.3 mmol/L — AB (ref 3.5–5.1)
Sodium: 138 mmol/L (ref 135–145)

## 2016-03-25 LAB — I-STAT TROPONIN, ED: TROPONIN I, POC: 0 ng/mL (ref 0.00–0.08)

## 2016-03-25 MED ORDER — IBUPROFEN 600 MG PO TABS: 600.0000 mg | ORAL_TABLET | Freq: Four times a day (QID) | ORAL | Status: DC | PRN

## 2016-03-25 NOTE — ED Notes (Signed)
Pt c/o left arm pain radiating into left chest onset yesterday, ataxia with ear pain x several months, bilateral hand pain. Was triaged in ED last night for same but left without being seen. Had EKG performed at that time.

## 2016-03-25 NOTE — ED Notes (Signed)
Pt called x 3 in [facility], no answer

## 2016-03-25 NOTE — ED Notes (Signed)
Pt called x1 to be placed in room with no response.

## 2016-03-25 NOTE — ED Provider Notes (Signed)
CSN: [id number]     Arrival date & time 03/25/16  1613 History   First MD Initiated Contact with Patient 03/25/16 2011     Chief Complaint  Patient presents with  . Arm Pain  . Gait Problem    HPI   30 year old female presents today with complaints of left arm pain. Patient reports the arm pain started last night, described as achy, not made worse by any movements of the head and neck or upper extremity. Patient reports that she was lifting heavy boxes earlier in the day, but denies that this is an exacerbating event. She reports the pain is steady in nature, and intermittently resolves on its own. Patient reports she was able to work today without any pain, but pain returned later this afternoon. She denies any loss of distal sensation strength or motor function. Patient also notes that she's felt "off balance" numerous attempts at description were nonproductive. Patient reports that she feels this is likely due to her glasses as she just recently had them change. She denies any headache, lightheadedness, dizziness, fever, neck stiffness, weight loss or any other concerning signs or symptoms.  Patient denies any chest pain, shortness of breath, palpitations, diaphoresis.      Past Medical History  Diagnosis Date  . Reflux   . Ovarian cyst    History reviewed. No pertinent past surgical history. History reviewed. No pertinent family history. Social History  Substance Use Topics  . Smoking status: Never Smoker   . Smokeless tobacco: Never Used  . Alcohol Use: No   OB History    Gravida Para Term Preterm AB TAB SAB Ectopic Multiple Living   1              Review of Systems  All other systems reviewed and are negative.   Allergies  Review of patient's allergies indicates no known allergies.  Home Medications   Prior to Admission medications   Medication Sig Start Date End Date Taking? Authorizing Provider  cetirizine (ZYRTEC) 10 MG tablet Take 1 tablet (10 mg total) by  mouth daily. Patient not taking: Reported on 03/25/2016 02/11/16   Cheri Fowler, PA-C  diclofenac (VOLTAREN) 50 MG EC tablet Take 1 tablet (50 mg total) by mouth 2 (two) times daily. Patient not taking: Reported on 03/25/2016 11/26/15   Janne Napoleon, NP  HYDROcodone-acetaminophen (NORCO/VICODIN) 5-325 MG tablet Take 1-2 tablets by mouth every 6 (six) hours as needed for severe pain. Patient not taking: Reported on 03/25/2016 10/09/15   Roxy Horseman, PA-C  ibuprofen (ADVIL,MOTRIN) 600 MG tablet Take 1 tablet (600 mg total) by mouth every 6 (six) hours as needed. 03/25/16   Khoi Hamberger, PA-C   BP 118/68 mmHg  Pulse 100  Temp(Src) 97.7 F (36.5 C) (Oral)  Resp 18  SpO2 100%  LMP 03/01/2016   Physical Exam  Constitutional: She is oriented to person, place, and time. She appears well-developed and well-nourished.  HENT:  Head: Normocephalic and atraumatic.  Eyes: Conjunctivae are normal. Pupils are equal, round, and reactive to light. Right eye exhibits no discharge. Left eye exhibits no discharge. No scleral icterus.  Neck: Normal range of motion. No JVD present. No tracheal deviation present.  Pulmonary/Chest: Effort normal. No stridor.  Musculoskeletal:  Full active range of motion of the neck left shoulder and upper extremity. 5 out of 5 strength, sensation intact, no pulse 2+. Extremities nontender to palpation, symptoms not worsened with any range of motion.  Neurological: She is alert and oriented  to person, place, and time. Coordination normal.  Psychiatric: She has a normal mood and affect. Her behavior is normal. Judgment and thought content normal.  Nursing note and vitals reviewed.   ED Course  Procedures (including critical care time) Labs Review Labs Reviewed  BASIC METABOLIC PANEL - Abnormal; Notable for the following:    Potassium 3.3 (*)    Glucose, Bld 122 (*)    All other components within normal limits  CBC  I-STAT TROPOININ, ED    Imaging Review Dg Chest 2  View  03/25/2016  CLINICAL DATA:  Pt complains of upper left chest pain that travels down her arm since yesterday. Shielded pt. EXAM: CHEST - 2 VIEW COMPARISON:  03/17/2016 FINDINGS: Lungs are clear. Heart size and mediastinal contours are within normal limits. No effusion.  No pneumothorax. Visualized skeletal structures are unremarkable. IMPRESSION: No acute cardiopulmonary disease. Electronically Signed   By: Corlis Leak  Hassell M.D.   On: 03/25/2016 17:14   I have personally reviewed and evaluated these images and lab results as part of my medical decision-making.   EKG Interpretation   Date/Time:  Monday March 25 2016 16:42:18 EDT Ventricular Rate:  101 PR Interval:  163 QRS Duration: 79 QT Interval:  351 QTC Calculation: 455 R Axis:   40 Text Interpretation:  Sinus tachycardia ED PHYSICIAN INTERPRETATION  AVAILABLE IN [facility] Confirmed by TEST, Record ([id number]) on  03/26/2016 6:56:30 AM      MDM   Final diagnoses:  Pain of left upper extremity    Labs:  Imaging:  Consults:  Therapeutics:  Discharge Meds:   Assessment/Plan: 30 year old female presents today with likely musculoskeletal pain. Intermittent pain, no neurological deficits. Patient admits that this is likely some anxious component to the pain. Patient will be instructed to use ibuprofen or Tylenol.  Follow-up with primary care if symptoms continue to persist, return to the ED if they worsen. Patient verbalized understanding and agreement today's plan had no further questions or concerns at the time of discharge.          Eyvonne MechanicJeffrey Niani Mourer, PA-C 03/27/16 0133  Lorre NickAnthony Allen, MD 03/28/16 [id number]

## 2016-03-25 NOTE — Discharge Instructions (Signed)

## 2016-03-25 NOTE — ED Notes (Signed)
Pt c/o increased depression, anxiety, crying spells. Feeling "off balance" 1-2 months, more so when standing. R hand "felt funny" onset 2 days ago, L anterior chest pain radiating down L arm X 1-2 weeks.  Pt became very upset when she learned HR was 98.

## 2016-03-26 DIAGNOSIS — R350 Frequency of micturition: Secondary | ICD-10-CM | POA: Diagnosis not present

## 2016-03-26 DIAGNOSIS — M940 Chondrocostal junction syndrome [Tietze]: Secondary | ICD-10-CM | POA: Diagnosis not present

## 2016-03-26 DIAGNOSIS — E876 Hypokalemia: Secondary | ICD-10-CM | POA: Diagnosis not present

## 2016-03-26 DIAGNOSIS — M7542 Impingement syndrome of left shoulder: Secondary | ICD-10-CM | POA: Diagnosis not present

## 2016-04-14 ENCOUNTER — Emergency Department (HOSPITAL_COMMUNITY)
Admission: EM | Admit: 2016-04-14 | Discharge: 2016-04-14 | Disposition: A | Discharge status: Home / Self Care | Payer: Medicare Other | Attending: Emergency Medicine | Admitting: Emergency Medicine

## 2016-04-14 ENCOUNTER — Emergency Department (HOSPITAL_COMMUNITY): Payer: Medicare Other

## 2016-04-14 ENCOUNTER — Encounter (HOSPITAL_COMMUNITY): Payer: Self-pay | Admitting: Emergency Medicine

## 2016-04-14 DIAGNOSIS — R079 Chest pain, unspecified: Secondary | ICD-10-CM | POA: Diagnosis not present

## 2016-04-14 LAB — CBC
HCT: 36.7 % (ref 36.0–46.0)
HEMOGLOBIN: 12 g/dL (ref 12.0–15.0)
MCH: 27.2 pg (ref 26.0–34.0)
MCHC: 32.7 g/dL (ref 30.0–36.0)
MCV: 83.2 fL (ref 78.0–100.0)
PLATELETS: 226 10*3/uL (ref 150–400)
RBC: 4.41 MIL/uL (ref 3.87–5.11)
RDW: 13.2 % (ref 11.5–15.5)
WBC: 6.4 10*3/uL (ref 4.0–10.5)

## 2016-04-14 LAB — BASIC METABOLIC PANEL
Anion gap: 4 — ABNORMAL LOW (ref 5–15)
BUN: 14 mg/dL (ref 6–20)
CO2: 26 mmol/L (ref 22–32)
CREATININE: 0.73 mg/dL (ref 0.44–1.00)
Calcium: 9.1 mg/dL (ref 8.9–10.3)
Chloride: 111 mmol/L (ref 101–111)
GFR calc Af Amer: >60 mL/min (ref >60)
GLUCOSE: 84 mg/dL (ref 65–99)
POTASSIUM: 3.8 mmol/L (ref 3.5–5.1)
Sodium: 141 mmol/L (ref 135–145)

## 2016-04-14 LAB — I-STAT TROPONIN, ED: Troponin i, poc: 0 ng/mL (ref 0.00–0.08)

## 2016-04-14 NOTE — ED Notes (Addendum)
Pt c/o intermittent chest pain since last night. Pt sts she feels a constant pressure with intermittent sharp pains. Pt denies worsening of pain with movement or palpation. Pt c/o radiation of pain to R shoulder blade. Pt also c/o burning in upper central abdomen and a burning in her throat. A&Ox4 and ambulatory. Denies N/V/D, dizziness, lightheadedness, SOB.

## 2016-04-14 NOTE — ED Notes (Signed)
Pt called for room x2 no answer from lobby

## 2016-06-11 DIAGNOSIS — M722 Plantar fascial fibromatosis: Secondary | ICD-10-CM | POA: Diagnosis not present

## 2016-06-25 ENCOUNTER — Ambulatory Visit (HOSPITAL_COMMUNITY)
Admission: EM | Admit: 2016-06-25 | Discharge: 2016-06-25 | Disposition: A | Discharge status: Home / Self Care | Payer: Medicare Other | Attending: Family Medicine | Admitting: Family Medicine

## 2016-06-25 ENCOUNTER — Encounter (HOSPITAL_COMMUNITY): Payer: Self-pay | Admitting: Emergency Medicine

## 2016-06-25 DIAGNOSIS — R51 Headache: Secondary | ICD-10-CM | POA: Insufficient documentation

## 2016-06-25 DIAGNOSIS — R0981 Nasal congestion: Secondary | ICD-10-CM | POA: Diagnosis not present

## 2016-06-25 DIAGNOSIS — Z79899 Other long term (current) drug therapy: Secondary | ICD-10-CM | POA: Insufficient documentation

## 2016-06-25 DIAGNOSIS — H9202 Otalgia, left ear: Secondary | ICD-10-CM | POA: Diagnosis not present

## 2016-06-25 DIAGNOSIS — J029 Acute pharyngitis, unspecified: Secondary | ICD-10-CM | POA: Insufficient documentation

## 2016-06-25 DIAGNOSIS — J069 Acute upper respiratory infection, unspecified: Secondary | ICD-10-CM

## 2016-06-25 LAB — POCT RAPID STREP A: Streptococcus, Group A Screen (Direct): NEGATIVE

## 2016-06-25 MED ORDER — AZITHROMYCIN 250 MG PO TABS: ORAL_TABLET | ORAL | Status: DC

## 2016-06-25 MED ORDER — IPRATROPIUM BROMIDE 0.06 % NA SOLN: 2.0000 | Freq: Four times a day (QID) | NASAL | Status: DC

## 2016-06-25 NOTE — ED Notes (Signed)
Pt has been suffering from sinus pain and congestion, left ear pain and a sore throat since last night.  Pt denies fever.

## 2016-06-25 NOTE — ED Provider Notes (Signed)
CSN: [id number]     Arrival date & time 06/25/16  1329 History   First MD Initiated Contact with Patient 06/25/16 1347     Chief Complaint  Patient presents with  . Sore Throat  . Otalgia    left  . Nasal Congestion  . Headache   (Consider location/radiation/quality/duration/timing/severity/associated sxs/prior Treatment) Patient is a 30 y.o. female presenting with pharyngitis. The history is provided by the patient.  Sore Throat This is a new problem. The current episode started yesterday. The problem has not changed since onset.The symptoms are aggravated by swallowing.    Past Medical History  Diagnosis Date  . Reflux   . Ovarian cyst    History reviewed. No pertinent past surgical history. History reviewed. No pertinent family history. Social History  Substance Use Topics  . Smoking status: Never Smoker   . Smokeless tobacco: Never Used  . Alcohol Use: No   OB History    Gravida Para Term Preterm AB TAB SAB Ectopic Multiple Living   1              Review of Systems  Constitutional: Negative.  Negative for fever.  HENT: Positive for congestion, ear pain, postnasal drip, rhinorrhea and sore throat. Negative for ear discharge and facial swelling.   Respiratory: Negative.   Cardiovascular: Negative.   Gastrointestinal: Negative.   All other systems reviewed and are negative.   Allergies  Review of patient's allergies indicates no known allergies.  Home Medications   Prior to Admission medications   Medication Sig Start Date End Date Taking? Authorizing Provider  azithromycin (ZITHROMAX Z-PAK) 250 MG tablet Take as directed on pack 06/25/16   Linna HoffJames D Antrell Tipler, MD  cetirizine (ZYRTEC) 10 MG tablet Take 1 tablet (10 mg total) by mouth daily. Patient not taking: Reported on 03/25/2016 02/11/16   Cheri FowlerKayla Rose, PA-C  diclofenac (VOLTAREN) 50 MG EC tablet Take 1 tablet (50 mg total) by mouth 2 (two) times daily. Patient not taking: Reported on 03/25/2016 11/26/15   Janne NapoleonHope M  Neese, NP  HYDROcodone-acetaminophen (NORCO/VICODIN) 5-325 MG tablet Take 1-2 tablets by mouth every 6 (six) hours as needed for severe pain. Patient not taking: Reported on 03/25/2016 10/09/15   Roxy Horsemanobert Browning, PA-C  ibuprofen (ADVIL,MOTRIN) 600 MG tablet Take 1 tablet (600 mg total) by mouth every 6 (six) hours as needed. 03/25/16   Eyvonne MechanicJeffrey Hedges, PA-C  ipratropium (ATROVENT) 0.06 % nasal spray Place 2 sprays into both nostrils 4 (four) times daily. 06/25/16   Linna HoffJames D Jacki Couse, MD   Meds Ordered and Administered this Visit  Medications - No data to display  BP 128/90 mmHg  Pulse 88  Temp(Src) 98.3 F (36.8 C) (Oral)  Resp 18  SpO2 100%  LMP 06/20/2016 (Exact Date) No data found.   Physical Exam  Constitutional: She is oriented to person, place, and time. She appears well-developed and well-nourished. No distress.  HENT:  Right Ear: External ear normal.  Left Ear: External ear normal.  Mouth/Throat: Oropharynx is clear and moist.  Eyes: Conjunctivae are normal. Pupils are equal, round, and reactive to light.  Neck: Normal range of motion. Neck supple.  Cardiovascular: Normal rate, normal heart sounds and intact distal pulses.   Pulmonary/Chest: Effort normal and breath sounds normal.  Neurological: She is alert and oriented to person, place, and time.  Skin: Skin is warm and dry.  Nursing note and vitals reviewed.   ED Course  Procedures (including critical care time)  Labs Review Labs Reviewed  POCT RAPID STREP A   Rapid strep neg.  Imaging Review No results found.   Visual Acuity Review  Right Eye Distance:   Left Eye Distance:   Bilateral Distance:    Right Eye Near:   Left Eye Near:    Bilateral Near:         MDM   1. URI (upper respiratory infection)        Linna HoffJames D Amyiah Gaba, MD 06/25/16 1416

## 2016-06-25 NOTE — Discharge Instructions (Signed)
Drink lots of fluids, take all of medicine, use lozenges as needed.return if needed °

## 2016-06-26 DIAGNOSIS — H018 Other specified inflammations of eyelid: Secondary | ICD-10-CM | POA: Diagnosis not present

## 2016-06-28 LAB — CULTURE, GROUP A STREP (THRC)

## 2016-10-30 ENCOUNTER — Encounter (HOSPITAL_COMMUNITY): Payer: Self-pay | Admitting: Emergency Medicine

## 2016-10-30 ENCOUNTER — Ambulatory Visit (HOSPITAL_COMMUNITY)
Admission: EM | Admit: 2016-10-30 | Discharge: 2016-10-30 | Disposition: A | Discharge status: Home / Self Care | Payer: Medicare Other | Attending: Emergency Medicine | Admitting: Emergency Medicine

## 2016-10-30 DIAGNOSIS — K5904 Chronic idiopathic constipation: Secondary | ICD-10-CM

## 2016-10-30 DIAGNOSIS — J00 Acute nasopharyngitis [common cold]: Secondary | ICD-10-CM | POA: Diagnosis not present

## 2016-10-30 DIAGNOSIS — K2950 Unspecified chronic gastritis without bleeding: Secondary | ICD-10-CM

## 2016-10-30 MED ORDER — PREDNISONE 50 MG PO TABS: ORAL_TABLET | ORAL | 0 refills | Status: DC

## 2016-10-30 MED ORDER — SENNOSIDES 8.6 MG PO TABS: 2.0000 | ORAL_TABLET | Freq: Every day | ORAL | 0 refills | Status: DC

## 2016-10-30 MED ORDER — OMEPRAZOLE 20 MG PO CPDR: 20.0000 mg | DELAYED_RELEASE_CAPSULE | Freq: Two times a day (BID) | ORAL | 0 refills | Status: DC

## 2016-10-30 NOTE — ED Triage Notes (Signed)
[facility] a complaint of a sore throat, bilateral ear pain, and nasal drainage x 2 days. The patient stated that she has nasal drainage that is causing nausea.

## 2016-10-30 NOTE — Discharge Instructions (Signed)
You have a virus causing your runny nose, sore throat, and ear pain. Take prednisone daily for 5 days. This will help clear up the congestion and pain. Prednisone can upset your stomach so is very important to take the omeprazole. Take omeprazole twice a day for the next month to help with your stomach. Take Senokot 2 tablets at bedtime until you are having a bowel movement at least every other day. Follow-up with your primary care doctor in the next month for your stomach and constipation.

## 2016-10-30 NOTE — ED Provider Notes (Signed)
[facility]    CSN: [id number] Arrival date & time: 10/30/16  1026     History   Chief Complaint Chief Complaint  Patient presents with  . Sore Throat  . Otalgia    HPI Mariah Rosales is a 30 y.o. female.   HPI  She is a 30 year old woman here for evaluation of sore throat. Her symptoms started yesterday with nasal congestion, rhinorrhea, sinus drainage, sore throat, and bilateral ear pain. She denies any fevers. She does report mild cough. No shortness of breath or wheezing. She also reports a longtime issue with gastritis and reflux. She states this has been worse over the last several days. She used to be on medication, but has not taken anything for quite some time. She also reports chronic constipation with anywhere from 2-4 bowel movements per month. She has never taken medication for this.  Past Medical History:  Diagnosis Date  . Ovarian cyst   . Reflux     There are no active problems to display for this patient.   History reviewed. No pertinent surgical history.  OB History    Gravida Para Term Preterm AB Living   1             SAB TAB Ectopic Multiple Live Births                   Home Medications    Prior to Admission medications   Medication Sig Start Date End Date Taking? Authorizing Provider  ibuprofen (ADVIL,MOTRIN) 600 MG tablet Take 1 tablet (600 mg total) by mouth every 6 (six) hours as needed. 03/25/16   Eyvonne MechanicJeffrey Hedges, PA-C  ipratropium (ATROVENT) 0.06 % nasal spray Place 2 sprays into both nostrils 4 (four) times daily. 06/25/16   Linna HoffJames D Kindl, MD  omeprazole (PRILOSEC) 20 MG capsule Take 1 capsule (20 mg total) by mouth 2 (two) times daily before a meal. 10/30/16   Charm RingsErin J Jonathin Heinicke, MD  predniSONE (DELTASONE) 50 MG tablet Take 1 pill daily for 5 days. 10/30/16   Charm RingsErin J Lestat Golob, MD  senna (SENOKOT) 8.6 MG tablet Take 2 tablets (17.2 mg total) by mouth daily. 10/30/16   Charm RingsErin J Ruthella Kirchman, MD    Family History History reviewed. No pertinent  family history.  Social History Social History  Substance Use Topics  . Smoking status: Never Smoker  . Smokeless tobacco: Never Used  . Alcohol use No     Allergies   Review of patient's allergies indicates no known allergies.   Review of Systems Review of Systems As in history of present illness  Physical Exam Triage Vital Signs ED Triage Vitals  Enc Vitals Group     BP 10/30/16 1049 102/74     Pulse Rate 10/30/16 1049 102     Resp 10/30/16 1049 20     Temp 10/30/16 1049 98.6 F (37 C)     Temp Source 10/30/16 1049 Oral     SpO2 10/30/16 1049 99 %     Weight --      Height --      Head Circumference --      Peak Flow --      Pain Score 10/30/16 1054 5     Pain Loc --      Pain Edu? --      Excl. in GC? --    No data found.   Updated Vital Signs BP 102/74 (BP Location: Right Arm)   Pulse 102   Temp  98.6 F (37 C) (Oral)   Resp 20   LMP 10/06/2016 (Exact Date)   SpO2 99%   Visual Acuity Right Eye Distance:   Left Eye Distance:   Bilateral Distance:    Right Eye Near:   Left Eye Near:    Bilateral Near:     Physical Exam  Constitutional: She is oriented to person, place, and time. She appears well-developed and well-nourished. No distress.  HENT:  Mouth/Throat: No oropharyngeal exudate.  TMs normal bilaterally. Nasal mucosa slightly inflamed. Tonsils mildly erythematous and swollen, but without exudate.  Neck: Normal range of motion. Neck supple.  Cardiovascular: Normal rate, regular rhythm and normal heart sounds.   No murmur heard. Pulmonary/Chest: Effort normal and breath sounds normal. No respiratory distress. She has no wheezes. She has no rales.  Abdominal: Soft. She exhibits no distension. There is tenderness (in epigastric). There is no rebound and no guarding.  Lymphadenopathy:    She has no cervical adenopathy.  Neurological: She is alert and oriented to person, place, and time.     UC Treatments / Results  Labs (all labs ordered  are listed, but only abnormal results are displayed) Labs Reviewed - No data to display  EKG  EKG Interpretation None       Radiology No results found.  Procedures Procedures (including critical care time)  Medications Ordered in UC Medications - No data to display   Initial Impression / Assessment and Plan / UC Course  I have reviewed the triage vital signs and the nursing notes.  Pertinent labs & imaging results that were available during my care of the patient were reviewed by me and considered in my medical decision making (see chart for details).  Clinical Course    Will start Senokot for chronic constipation. Omeprazole twice a day for gastritis. Discussed options for upper respiratory infection. To help control her symptoms, we will do a short course of prednisone. Discussed that this may cause her gastritis to worsen, especially if she does not take the omeprazole. Follow-up with PCP for gastritis and constipation.  Final Clinical Impressions(s) / UC Diagnoses   Final diagnoses:  Acute nasopharyngitis  Chronic gastritis without bleeding, unspecified gastritis type  Chronic idiopathic constipation    New Prescriptions Discharge Medication List as of 10/30/2016 11:29 AM    START taking these medications   Details  omeprazole (PRILOSEC) 20 MG capsule Take 1 capsule (20 mg total) by mouth 2 (two) times daily before a meal., Starting Wed 10/30/2016, Normal    predniSONE (DELTASONE) 50 MG tablet Take 1 pill daily for 5 days., Normal    senna (SENOKOT) 8.6 MG tablet Take 2 tablets (17.2 mg total) by mouth daily., Starting Wed 10/30/2016, Normal         Charm RingsErin J Rozell Kettlewell, MD 10/30/16 1159

## 2016-12-20 ENCOUNTER — Ambulatory Visit (HOSPITAL_COMMUNITY)
Admission: EM | Admit: 2016-12-20 | Discharge: 2016-12-20 | Disposition: A | Discharge status: Home / Self Care | Payer: Medicare Other | Attending: Family Medicine | Admitting: Family Medicine

## 2016-12-20 ENCOUNTER — Ambulatory Visit (INDEPENDENT_AMBULATORY_CARE_PROVIDER_SITE_OTHER): Payer: Medicare Other

## 2016-12-20 ENCOUNTER — Encounter (HOSPITAL_COMMUNITY): Payer: Self-pay | Admitting: *Deleted

## 2016-12-20 DIAGNOSIS — M25572 Pain in left ankle and joints of left foot: Secondary | ICD-10-CM | POA: Diagnosis not present

## 2016-12-20 DIAGNOSIS — S93402A Sprain of unspecified ligament of left ankle, initial encounter: Secondary | ICD-10-CM

## 2016-12-20 DIAGNOSIS — S99912A Unspecified injury of left ankle, initial encounter: Secondary | ICD-10-CM | POA: Diagnosis not present

## 2016-12-20 NOTE — Discharge Instructions (Signed)
Wear ankle support as needed for comfort, activity as tolerated. advil and ice as needed, return or see orthopedist if further problems. °

## 2016-12-20 NOTE — ED Triage Notes (Signed)
Twisted  l lower leg   Today  On  Steps   Did  Not  Fall  But  Has  Pain  Below  Knee  And  l  Ankle        Pain on  Weight  Bearing

## 2016-12-20 NOTE — ED Provider Notes (Signed)
[facility]    CSN: [id number] Arrival date & time: 12/20/16  1823     History   Chief Complaint Chief Complaint  Patient presents with  . Leg Injury    HPI Mariah Rosales is a 30 y.o. female.   The history is provided by the patient.  Ankle Pain  Location:  Ankle Time since incident:  6 hours Injury: yes   Mechanism of injury comment:  Twisted going down steps. Ankle location:  L ankle Pain details:    Quality:  Sharp   Severity:  Mild   Onset quality:  Sudden   Progression:  Unchanged Chronicity:  New Dislocation: no   Foreign body present:  No foreign bodies Prior injury to area:  No Relieved by:  None tried Worsened by:  Nothing Ineffective treatments:  None tried Associated symptoms: decreased ROM and swelling     Past Medical History:  Diagnosis Date  . Ovarian cyst   . Reflux     There are no active problems to display for this patient.   History reviewed. No pertinent surgical history.  OB History    Gravida Para Term Preterm AB Living   1             SAB TAB Ectopic Multiple Live Births                   Home Medications    Prior to Admission medications   Medication Sig Start Date End Date Taking? Authorizing Provider  ibuprofen (ADVIL,MOTRIN) 600 MG tablet Take 1 tablet (600 mg total) by mouth every 6 (six) hours as needed. 03/25/16   Eyvonne MechanicJeffrey Hedges, PA-C  ipratropium (ATROVENT) 0.06 % nasal spray Place 2 sprays into both nostrils 4 (four) times daily. 06/25/16   Linna HoffJames D Alease Fait, MD  omeprazole (PRILOSEC) 20 MG capsule Take 1 capsule (20 mg total) by mouth 2 (two) times daily before a meal. 10/30/16   Charm RingsErin J Honig, MD  predniSONE (DELTASONE) 50 MG tablet Take 1 pill daily for 5 days. 10/30/16   Charm RingsErin J Honig, MD  senna (SENOKOT) 8.6 MG tablet Take 2 tablets (17.2 mg total) by mouth daily. 10/30/16   Charm RingsErin J Honig, MD    Family History History reviewed. No pertinent family history.  Social History Social History  Substance  Use Topics  . Smoking status: Never Smoker  . Smokeless tobacco: Never Used  . Alcohol use No     Allergies   Patient has no known allergies.   Review of Systems Review of Systems  Musculoskeletal: Positive for gait problem and joint swelling.  All other systems reviewed and are negative.    Physical Exam Triage Vital Signs ED Triage Vitals [12/20/16 1845]  Enc Vitals Group     BP 161/97     Pulse Rate 99     Resp 16     Temp 98 F (36.7 C)     Temp Source Oral     SpO2 99 %     Weight      Height      Head Circumference      Peak Flow      Pain Score 5     Pain Loc      Pain Edu?      Excl. in GC?    No data found.   Updated Vital Signs BP 115/74 (BP Location: Right Arm)   Pulse 99   Temp 98 F (36.7 C) (Oral)  Resp 16   LMP 12/03/2016   SpO2 99%   Visual Acuity Right Eye Distance:   Left Eye Distance:   Bilateral Distance:    Right Eye Near:   Left Eye Near:    Bilateral Near:     Physical Exam  Constitutional: She appears well-developed and well-nourished. No distress.  Musculoskeletal: She exhibits tenderness. She exhibits no deformity.       Left ankle: She exhibits decreased range of motion and swelling. She exhibits no ecchymosis, no deformity and normal pulse. Tenderness. Lateral malleolus and proximal fibula tenderness found. No posterior TFL and no head of 5th metatarsal tenderness found. Achilles tendon exhibits no pain, no defect and normal Thompson's test results.  Neurological: She is alert.  Skin: Skin is warm and dry.  Nursing note and vitals reviewed.    UC Treatments / Results  Labs (all labs ordered are listed, but only abnormal results are displayed) Labs Reviewed - No data to display  EKG  EKG Interpretation None       Radiology No results found.  Procedures Procedures (including critical care time)  Medications Ordered in UC Medications - No data to display   Initial Impression / Assessment and Plan / UC  Course  I have reviewed the triage vital signs and the nursing notes.  Pertinent labs & imaging results that were available during my care of the patient were reviewed by me and considered in my medical decision making (see chart for details).  Clinical Course       Final Clinical Impressions(s) / UC Diagnoses   Final diagnoses:  None    New Prescriptions New Prescriptions   No medications on file     Linna HoffJames D Ariyanah Aguado, MD 12/20/16 [phone]

## 2017-01-28 ENCOUNTER — Emergency Department (HOSPITAL_COMMUNITY): Payer: Medicare Other

## 2017-01-28 ENCOUNTER — Encounter (HOSPITAL_COMMUNITY): Payer: Self-pay | Admitting: Emergency Medicine

## 2017-01-28 ENCOUNTER — Emergency Department (HOSPITAL_COMMUNITY)
Admission: EM | Admit: 2017-01-28 | Discharge: 2017-01-28 | Disposition: A | Discharge status: Home / Self Care | Payer: Medicare Other | Attending: Emergency Medicine | Admitting: Emergency Medicine

## 2017-01-28 DIAGNOSIS — R0989 Other specified symptoms and signs involving the circulatory and respiratory systems: Secondary | ICD-10-CM | POA: Diagnosis not present

## 2017-01-28 DIAGNOSIS — T189XXA Foreign body of alimentary tract, part unspecified, initial encounter: Secondary | ICD-10-CM | POA: Diagnosis not present

## 2017-01-28 MED ORDER — IOPAMIDOL (ISOVUE-300) INJECTION 61%
INTRAVENOUS | Status: AC
  Filled 2017-01-28: qty 75

## 2017-01-28 NOTE — ED Notes (Signed)
Pt called for triage. pts daughter states she is in bathroom. Will recall

## 2017-01-28 NOTE — ED Provider Notes (Signed)
[facility] Note  CSN: [id number] Arrival date & time: 01/28/17  1507  History   Chief Complaint No chief complaint on file.  HPI Mariah Rosales is a 31 y.o. female.  The history is provided by the patient and medical records. No language interpreter was used.  Illness  This is a new problem. The current episode started 12 to 24 hours ago. The problem occurs constantly. The problem has not changed since onset.Pertinent negatives include no chest pain, no abdominal pain, no headaches and no shortness of breath. The symptoms are aggravated by eating and swallowing. Nothing relieves the symptoms.   Past Medical History:  Diagnosis Date  . Ovarian cyst   . Reflux    There are no active problems to display for this patient.  History reviewed. No pertinent surgical history.  OB History    Gravida Para Term Preterm AB Living   1             SAB TAB Ectopic Multiple Live Births                 Home Medications    Prior to Admission medications   Medication Sig Start Date End Date Taking? Authorizing Provider  ibuprofen (ADVIL,MOTRIN) 600 MG tablet Take 1 tablet (600 mg total) by mouth every 6 (six) hours as needed. 03/25/16   Eyvonne MechanicJeffrey Hedges, PA-C  ipratropium (ATROVENT) 0.06 % nasal spray Place 2 sprays into both nostrils 4 (four) times daily. 06/25/16   Linna HoffJames D Kindl, MD  omeprazole (PRILOSEC) 20 MG capsule Take 1 capsule (20 mg total) by mouth 2 (two) times daily before a meal. 10/30/16   Charm RingsErin J Honig, MD  predniSONE (DELTASONE) 50 MG tablet Take 1 pill daily for 5 days. 10/30/16   Charm RingsErin J Honig, MD  senna (SENOKOT) 8.6 MG tablet Take 2 tablets (17.2 mg total) by mouth daily. 10/30/16   Charm RingsErin J Honig, MD   Family History No family history on file.  Social History Social History  Substance Use Topics  . Smoking status: Never Smoker  . Smokeless tobacco: Never Used  . Alcohol use No   Allergies   Patient has no known allergies.  Review of Systems Review of  Systems  HENT: Positive for sore throat. Negative for voice change.   Respiratory: Negative for shortness of breath.   Cardiovascular: Negative for chest pain.  Gastrointestinal: Negative for abdominal pain.  Neurological: Negative for headaches.  All other systems reviewed and are negative.  Physical Exam Updated Vital Signs BP 119/75   Pulse 97   Temp 98.1 F (36.7 C) (Oral)   Resp 16   LMP 01/24/2017   SpO2 100%   Physical Exam  Constitutional: She is oriented to person, place, and time. No distress.  Overweight young AA female  HENT:  Head: Normocephalic and atraumatic.  Eyes: EOM are normal. Pupils are equal, round, and reactive to light.  Neck: Normal range of motion. Neck supple.  Cardiovascular: Normal rate, regular rhythm and normal heart sounds.   Pulmonary/Chest: Effort normal and breath sounds normal.  Abdominal: Soft. Bowel sounds are normal. She exhibits no distension. There is no tenderness.  Musculoskeletal: Normal range of motion.  Neurological: She is alert and oriented to person, place, and time.  Skin: Skin is warm and dry. Capillary refill takes less than 2 seconds. She is not diaphoretic.  Nursing note and vitals reviewed.  ED Treatments / Results  Labs (all labs ordered are listed, but only abnormal  results are displayed) Labs Reviewed - No data to display  EKG  EKG Interpretation None      Radiology Dg Neck Soft Tissue  Result Date: 01/28/2017 CLINICAL DATA:  Patient swallowed a fish bone last night the feels as if it is stuck in the mid throat. EXAM: NECK SOFT TISSUES - 1+ VIEW COMPARISON:  None. FINDINGS: There is no evidence of retropharyngeal soft tissue swelling or epiglottic enlargement. Lingual tonsils appear prominent. The cervical airway is unremarkable and no radio-opaque foreign body identified. Metallic earrings are present bilaterally. On the lateral view, metallic buckles project over the clavicular region. IMPRESSION: No unexpected  radiopaque foreign body/fishbone is identified. Electronically Signed   By: Britta Mccreedy M.D.   On: 01/28/2017 16:05   Procedures Procedures (including critical care time)  Medications Ordered in ED Medications  iopamidol (ISOVUE-300) 61 % injection (not administered)    Initial Impression / Assessment and Plan / ED Course  I have reviewed the triage vital signs and the nursing notes.  31 y.o. female with above stated PMHx, HPI, and physical. Symptom onset yesterday evening while eating fish. Patient states that she swallowed a piece with bone and it experience sudden sharp pain in her left neck. Symptoms have been persistent since then and worse with swallowing. Although patient has odynophagia she has no dysphagia.  She comfortable appearing on exam. No evidence of trismus or tripoding or excessive drooling. X-ray showing no evidence of radiopaque foreign body. CT neck with contrast started but upon IV contrast administration, Pt felt anxious & the contrast was stopped. Pt eloped following this before being able to talk with her.  Imaging results were personally reviewed by myself and used in the medical decision making of this patient's treatment and disposition.  Pt care discussed with and followed by my attending, Dr. Gweneth Fritter, MD Pager [phone]  Final Clinical Impressions(s) / ED Diagnoses   Final diagnoses:  Sensation of foreign body in throat   New Prescriptions Discharge Medication List as of 01/28/2017  7:37 PM       Angelina Ok, MD 01/28/17 2055    Jacalyn Lefevre, MD 01/28/17 2109

## 2017-01-28 NOTE — ED Notes (Signed)
See note in chart

## 2017-01-28 NOTE — ED Notes (Signed)
Pt refused CT scan, Dr. Particia NearingHaviland notified.

## 2017-01-28 NOTE — ED Notes (Signed)
Pt and family at nurses station.  Pt states she is just going to go home that the fish bone isn't bothering her and she would like to see if it passes. Pt states understanding of possible risks and also understands to come back to the ED if she changes her mind or if situation worsens.

## 2017-01-28 NOTE — ED Triage Notes (Signed)
Pt states she ate fish yesterday and got a fish bone stuck in her throat. Shes tried gargle warm salt water, threw up twice to try and get it out, coughing, she ate bread and peanutbutter to try and get it down. Pt still complaining of pain to left side of neck where she feel likes the bone is stuck. Pt in nad. Breathing clearly.

## 2017-02-24 DIAGNOSIS — E119 Type 2 diabetes mellitus without complications: Secondary | ICD-10-CM | POA: Diagnosis not present

## 2017-02-24 DIAGNOSIS — H04121 Dry eye syndrome of right lacrimal gland: Secondary | ICD-10-CM | POA: Diagnosis not present

## 2017-04-01 DIAGNOSIS — Z319 Encounter for procreative management, unspecified: Secondary | ICD-10-CM | POA: Diagnosis not present

## 2017-04-18 DIAGNOSIS — N926 Irregular menstruation, unspecified: Secondary | ICD-10-CM | POA: Diagnosis not present

## 2017-05-06 DIAGNOSIS — N979 Female infertility, unspecified: Secondary | ICD-10-CM | POA: Diagnosis not present

## 2017-05-06 DIAGNOSIS — N926 Irregular menstruation, unspecified: Secondary | ICD-10-CM | POA: Diagnosis not present

## 2017-05-11 ENCOUNTER — Encounter (HOSPITAL_COMMUNITY): Payer: Self-pay | Admitting: Emergency Medicine

## 2017-05-11 ENCOUNTER — Ambulatory Visit (HOSPITAL_COMMUNITY)
Admission: EM | Admit: 2017-05-11 | Discharge: 2017-05-11 | Disposition: A | Discharge status: Home / Self Care | Payer: Medicare Other | Attending: Family Medicine | Admitting: Family Medicine

## 2017-05-11 DIAGNOSIS — K529 Noninfective gastroenteritis and colitis, unspecified: Secondary | ICD-10-CM

## 2017-05-11 DIAGNOSIS — R0789 Other chest pain: Secondary | ICD-10-CM

## 2017-05-11 DIAGNOSIS — R110 Nausea: Secondary | ICD-10-CM

## 2017-05-11 MED ORDER — ONDANSETRON 4 MG PO TBDP
4.0000 mg | ORAL_TABLET | Freq: Once | ORAL | Status: AC
  Administered 2017-05-11: 4 mg via ORAL

## 2017-05-11 MED ORDER — ONDANSETRON 8 MG PO TBDP: 8.0000 mg | ORAL_TABLET | Freq: Three times a day (TID) | ORAL | 0 refills | Status: DC | PRN

## 2017-05-11 MED ORDER — HYOSCYAMINE SULFATE SL 0.125 MG SL SUBL: 1.0000 | SUBLINGUAL_TABLET | SUBLINGUAL | 0 refills | Status: DC | PRN

## 2017-05-11 MED ORDER — ONDANSETRON 4 MG PO TBDP
ORAL_TABLET | ORAL | Status: AC
  Filled 2017-05-11: qty 1

## 2017-05-11 NOTE — ED Triage Notes (Signed)
Onset Friday of symptoms.  Abdominal pain, nausea, vomiting .  One episode of diarrhea.    2 vomiting episodes today, continues to feel nauseated

## 2017-05-11 NOTE — Discharge Instructions (Signed)
Take tylenol over the counter for left chest wall pain.

## 2017-05-11 NOTE — ED Provider Notes (Signed)
CSN: [id number]     Arrival date & time 05/11/17  1952 History   None    Chief Complaint  Patient presents with  . Abdominal Pain   (Consider location/radiation/quality/duration/timing/severity/associated sxs/prior Treatment) Patient c/o NVD and bowel cramps for several weeks.  She c/o left pectoral muscle discomfort from lifting at work. She c/o GERD sx's.  She has had GI sx's for awhile.   The history is provided by the patient.  Abdominal Pain  Pain location:  Generalized Pain quality: aching   Pain radiates to:  LUQ and RUQ Pain severity:  Mild Onset quality:  Sudden Duration:  2 weeks Timing:  Intermittent Progression:  Worsening Chronicity:  New Associated symptoms: nausea     Past Medical History:  Diagnosis Date  . Ovarian cyst   . Reflux    History reviewed. No pertinent surgical history. No family history on file. Social History  Substance Use Topics  . Smoking status: Never Smoker  . Smokeless tobacco: Never Used  . Alcohol use No   OB History    Gravida Para Term Preterm AB Living   1             SAB TAB Ectopic Multiple Live Births                 Review of Systems  Constitutional: Negative.   HENT: Negative.   Eyes: Negative.   Respiratory: Negative.   Cardiovascular: Negative.   Gastrointestinal: Positive for abdominal pain and nausea.  Endocrine: Negative.   Genitourinary: Negative.   Musculoskeletal: Negative.   Allergic/Immunologic: Negative.   Neurological: Negative.   Hematological: Negative.   Psychiatric/Behavioral: Negative.     Allergies  Patient has no known allergies.  Home Medications   Prior to Admission medications   Medication Sig Start Date End Date Taking? Authorizing Provider  Hyoscyamine Sulfate SL (LEVSIN/SL) 0.125 MG SUBL Place 1 tablet under the tongue every 4 (four) hours as needed. 05/11/17   Deatra Canterxford, Shavontae Gibeault J, FNP  ibuprofen (ADVIL,MOTRIN) 600 MG tablet Take 1 tablet (600 mg total) by mouth every 6 (six) hours  as needed. 03/25/16   Hedges, Tinnie GensJeffrey, PA-C  ipratropium (ATROVENT) 0.06 % nasal spray Place 2 sprays into both nostrils 4 (four) times daily. 06/25/16   Linna HoffKindl, James D, MD  omeprazole (PRILOSEC) 20 MG capsule Take 1 capsule (20 mg total) by mouth 2 (two) times daily before a meal. 10/30/16   Charm RingsHonig, Erin J, MD  ondansetron (ZOFRAN ODT) 8 MG disintegrating tablet Take 1 tablet (8 mg total) by mouth every 8 (eight) hours as needed for nausea or vomiting. 05/11/17   Deatra Canterxford, August Gosser J, FNP  predniSONE (DELTASONE) 50 MG tablet Take 1 pill daily for 5 days. 10/30/16   Charm RingsHonig, Erin J, MD  senna (SENOKOT) 8.6 MG tablet Take 2 tablets (17.2 mg total) by mouth daily. 10/30/16   Charm RingsHonig, Erin J, MD   Meds Ordered and Administered this Visit   Medications  ondansetron (ZOFRAN-ODT) disintegrating tablet 4 mg (4 mg Oral Given 05/11/17 2053)    BP 113/60 (BP Location: Right Arm) Comment (BP Location): large cuff  Pulse 100   Temp 98.1 F (36.7 C) (Oral)   Resp 18   LMP 04/21/2017   SpO2 (!) 87%  No data found.   Physical Exam  Constitutional: She appears well-developed and well-nourished.  HENT:  Head: Normocephalic and atraumatic.  Eyes: Conjunctivae and EOM are normal. Pupils are equal, round, and reactive to light.  Neck: Normal range of  motion. Neck supple.  Cardiovascular: Normal rate, regular rhythm and normal heart sounds.   Pulmonary/Chest: Effort normal and breath sounds normal.  Abdominal: Soft. Bowel sounds are normal.  Nursing note and vitals reviewed.   Urgent Care Course     Procedures (including critical care time)  Labs Review Labs Reviewed - No data to display  Imaging Review No results found.   Visual Acuity Review  Right Eye Distance:   Left Eye Distance:   Bilateral Distance:    Right Eye Near:   Left Eye Near:    Bilateral Near:         MDM   1. Left-sided chest wall pain   2. Nausea   3. Gastroenteritis    Zofran ODT 8mg  one po tid prn Levsin 0.125mg   po q 4 hours prn   Follow up with PCP   Note for work  Take tylenol otc for left chest wall pain.    Deatra Canter, [place] 05/11/17 2107

## 2017-05-16 ENCOUNTER — Ambulatory Visit (HOSPITAL_COMMUNITY)
Admission: EM | Admit: 2017-05-16 | Discharge: 2017-05-16 | Disposition: A | Discharge status: Home / Self Care | Payer: Medicare Other | Attending: Internal Medicine | Admitting: Internal Medicine

## 2017-05-16 ENCOUNTER — Encounter (HOSPITAL_COMMUNITY): Payer: Self-pay

## 2017-05-16 DIAGNOSIS — R197 Diarrhea, unspecified: Secondary | ICD-10-CM | POA: Diagnosis not present

## 2017-05-16 DIAGNOSIS — R1084 Generalized abdominal pain: Secondary | ICD-10-CM

## 2017-05-16 DIAGNOSIS — R112 Nausea with vomiting, unspecified: Secondary | ICD-10-CM

## 2017-05-16 DIAGNOSIS — K2900 Acute gastritis without bleeding: Secondary | ICD-10-CM | POA: Diagnosis not present

## 2017-05-16 MED ORDER — SUCRALFATE 1 G PO TABS: 1.0000 g | ORAL_TABLET | Freq: Three times a day (TID) | ORAL | 0 refills | Status: DC

## 2017-05-16 MED ORDER — PROMETHAZINE HCL 25 MG PO TABS: 25.0000 mg | ORAL_TABLET | Freq: Four times a day (QID) | ORAL | 0 refills | Status: DC | PRN

## 2017-05-16 NOTE — ED Provider Notes (Signed)
CSN: [id number]     Arrival date & time 05/16/17  1912 History   First MD Initiated Contact with Patient 05/16/17 2032     Chief Complaint  Patient presents with  . Abdominal Pain   (Consider location/radiation/quality/duration/timing/severity/associated sxs/prior Treatment) 31 year old female stating that 5 days ago she was seen at this urgent care with abdominal pain. She was having epigastric pain and diarrhea nausea and vomiting. She was diagnosed with gastroenteritis and treated with omeprazole and Zofran. She took Zofran and this created a rash around her mouth. She called her doctor and told her that might be an allergen to stop taking it. She is continuing to have vomiting 1-2 times per day. She is able to hold down liquids and some foods. She has diarrhea described as watery and occasionally loose almost every day. She has not had a normal bowel movement in several days. Most of her pain is across the upper abdomen, epigastrium and right upper quadrant.      Past Medical History:  Diagnosis Date  . Ovarian cyst   . Reflux    History reviewed. No pertinent surgical history. No family history on file. Social History  Substance Use Topics  . Smoking status: Never Smoker  . Smokeless tobacco: Never Used  . Alcohol use No   OB History    Gravida Para Term Preterm AB Living   1             SAB TAB Ectopic Multiple Live Births                 Review of Systems  Constitutional: Negative.  Negative for fever.  HENT: Negative.   Respiratory: Negative.   Cardiovascular: Negative.   Gastrointestinal: Positive for abdominal pain, diarrhea, nausea and vomiting. Negative for blood in stool.  Genitourinary: Negative.   Musculoskeletal: Negative.   Skin: Negative.   Hematological: Negative.   All other systems reviewed and are negative.   Allergies  Patient has no known allergies.  Home Medications   Prior to Admission medications   Medication Sig Start Date End Date  Taking? Authorizing Provider  Hyoscyamine Sulfate SL (LEVSIN/SL) 0.125 MG SUBL Place 1 tablet under the tongue every 4 (four) hours as needed. 05/11/17   Deatra Canterxford, William J, FNP  ibuprofen (ADVIL,MOTRIN) 600 MG tablet Take 1 tablet (600 mg total) by mouth every 6 (six) hours as needed. 03/25/16   Hedges, Tinnie GensJeffrey, PA-C  ipratropium (ATROVENT) 0.06 % nasal spray Place 2 sprays into both nostrils 4 (four) times daily. 06/25/16   Linna HoffKindl, James D, MD  omeprazole (PRILOSEC) 20 MG capsule Take 1 capsule (20 mg total) by mouth 2 (two) times daily before a meal. 10/30/16   Charm RingsHonig, Erin J, MD  promethazine (PHENERGAN) 25 MG tablet Take 1 tablet (25 mg total) by mouth every 6 (six) hours as needed for nausea (Nor V.). May cause drowsiness. 05/16/17   Hayden RasmussenMabe, Wayburn Shaler, NP  senna (SENOKOT) 8.6 MG tablet Take 2 tablets (17.2 mg total) by mouth daily. 10/30/16   Charm RingsHonig, Erin J, MD  sucralfate (CARAFATE) 1 g tablet Take 1 tablet (1 g total) by mouth 4 (four) times daily -  with meals and at bedtime. 05/16/17   Hayden RasmussenMabe, Keshawn Fiorito, NP   Meds Ordered and Administered this Visit  Medications - No data to display  BP 120/83   Pulse 74   Temp 98.3 F (36.8 C) (Oral)   Resp 20   LMP 04/21/2017 (Exact Date)   SpO2 100%  No  data found.   Physical Exam  Constitutional: She is oriented to person, place, and time. She appears well-developed and well-nourished. No distress.  Eyes: EOM are normal.  Neck: Normal range of motion. Neck supple.  Cardiovascular: Normal rate, regular rhythm and normal heart sounds.   Pulmonary/Chest: Effort normal and breath sounds normal. No respiratory distress.  Abdominal:  Abdomen soft. Moderate tenderness to the epigastrium. Some guarding with palpation of the epigastrium. Tenderness to the right upper quadrant. Equivocal Murphy sign. Remainder of the abdomen is obese and soft and deep palpation to one side of the abdomen may calls discomfort to the other side. Various areas. Percusses tympanic others dull  to flat. Bowel sounds are present but hypoactive.  Musculoskeletal: Normal range of motion. She exhibits no edema.  Neurological: She is alert and oriented to person, place, and time. Coordination normal.  Skin: Skin is warm and dry.  Psychiatric: She has a normal mood and affect.  Nursing note and vitals reviewed.   Urgent Care Course     Procedures (including critical care time)  Labs Review Labs Reviewed - No data to display  Imaging Review No results found.   Visual Acuity Review  Right Eye Distance:   Left Eye Distance:   Bilateral Distance:    Right Eye Near:   Left Eye Near:    Bilateral Near:         MDM   1. Generalized abdominal pain   2. Acute gastritis without hemorrhage, unspecified gastritis type   3. Nausea vomiting and diarrhea    Start taking the Carafate as directed to protect the lining of your stomach. Stop taking any BC powders. No aspirin or ibuprofen. Phenergan as directed for nausea and vomiting. Zantac 150 mg twice a day. Continue taking the omeprazole. If during the weekend you develop increase abdominal pain, persistent vomiting, unable to hold down medicines or food, worsening diarrhea go to the emergency department promptly. Otherwise if he remains stable see your doctor on Monday as scheduled. Meds ordered this encounter  Medications  . sucralfate (CARAFATE) 1 g tablet    Sig: Take 1 tablet (1 g total) by mouth 4 (four) times daily -  with meals and at bedtime.    Dispense:  40 tablet    Refill:  0    Order Specific Question:   Supervising Provider    Answer:   Eustace Moore [[id number]]  . promethazine (PHENERGAN) 25 MG tablet    Sig: Take 1 tablet (25 mg total) by mouth every 6 (six) hours as needed for nausea (Nor V.). May cause drowsiness.    Dispense:  20 tablet    Refill:  0    Order Specific Question:   Supervising Provider    Answer:   Eustace Moore [[id number]]       Hayden Rasmussen, NP 05/16/17 2101    Hayden Rasmussen,  NP 05/16/17 2107

## 2017-05-16 NOTE — ED Triage Notes (Signed)
Pt here to follow up on her abdominal pain and still having episodes of vomiting. Took a BC, said she could not take zofran because it caused a rash around her mouth so she stopped taking it. No fever.

## 2017-05-16 NOTE — Discharge Instructions (Signed)
Start taking the Carafate as directed to protect the lining of your stomach. Stop taking any BC powders. No aspirin or ibuprofen. Phenergan as directed for nausea and vomiting. Zantac 150 mg twice a day. Continue taking the omeprazole. If during the weekend you develop increase abdominal pain, persistent vomiting, unable to hold down medicines or food, worsening diarrhea go to the emergency department promptly. Otherwise if he remains stable see your doctor on Monday as scheduled.

## 2017-06-18 DIAGNOSIS — N970 Female infertility associated with anovulation: Secondary | ICD-10-CM | POA: Diagnosis not present

## 2017-07-04 ENCOUNTER — Encounter (HOSPITAL_COMMUNITY): Payer: Self-pay | Admitting: Nurse Practitioner

## 2017-07-04 ENCOUNTER — Emergency Department (HOSPITAL_COMMUNITY)
Admission: EM | Admit: 2017-07-04 | Discharge: 2017-07-04 | Disposition: A | Discharge status: Home / Self Care | Payer: Medicare Other | Attending: Emergency Medicine | Admitting: Emergency Medicine

## 2017-07-04 DIAGNOSIS — N390 Urinary tract infection, site not specified: Secondary | ICD-10-CM

## 2017-07-04 DIAGNOSIS — R208 Other disturbances of skin sensation: Secondary | ICD-10-CM | POA: Diagnosis not present

## 2017-07-04 DIAGNOSIS — Z7984 Long term (current) use of oral hypoglycemic drugs: Secondary | ICD-10-CM | POA: Diagnosis not present

## 2017-07-04 DIAGNOSIS — R209 Unspecified disturbances of skin sensation: Secondary | ICD-10-CM

## 2017-07-04 DIAGNOSIS — R6883 Chills (without fever): Secondary | ICD-10-CM | POA: Diagnosis not present

## 2017-07-04 LAB — URINALYSIS, ROUTINE W REFLEX MICROSCOPIC
Bilirubin Urine: NEGATIVE
GLUCOSE, UA: NEGATIVE mg/dL
KETONES UR: NEGATIVE mg/dL
Nitrite: NEGATIVE
Protein, ur: NEGATIVE mg/dL
Specific Gravity, Urine: 1.024 (ref 1.005–1.030)
pH: 6 (ref 5.0–8.0)

## 2017-07-04 LAB — I-STAT CHEM 8, ED
BUN: 10 mg/dL (ref 6–20)
CREATININE: 0.7 mg/dL (ref 0.44–1.00)
Calcium, Ion: 1.28 mmol/L (ref 1.15–1.40)
Chloride: 104 mmol/L (ref 101–111)
Glucose, Bld: 99 mg/dL (ref 65–99)
HEMATOCRIT: 39 % (ref 36.0–46.0)
HEMOGLOBIN: 13.3 g/dL (ref 12.0–15.0)
Potassium: 3.5 mmol/L (ref 3.5–5.1)
SODIUM: 141 mmol/L (ref 135–145)
TCO2: 28 mmol/L (ref 0–100)

## 2017-07-04 LAB — POC URINE PREG, ED: Preg Test, Ur: NEGATIVE

## 2017-07-04 MED ORDER — NITROFURANTOIN MONOHYD MACRO 100 MG PO CAPS: 100.0000 mg | ORAL_CAPSULE | Freq: Two times a day (BID) | ORAL | 0 refills | Status: AC

## 2017-07-04 NOTE — ED Triage Notes (Signed)
Pt states she is experiencing a "cold aggreavation sensation to her left side of her head." Onset being today.

## 2017-07-04 NOTE — ED Provider Notes (Signed)
[facility] Provider Note   CSN: [id number] Arrival date & time: 07/04/17  1854     History   Chief Complaint Chief Complaint  Patient presents with  . Cold Head Sensation    HPI Mariah Rosales is a 31 y.o. female.  HPI  31 year old female presents with a "cold sensation" over her left scalp/forehead. Patient states it started this afternoon at around 3 PM. She states it seems to come and go. It is not a pain and it is not numbness. She states it lasts seconds at a time but less than a minute. Does not know what causes it to come and go. No headache, blurry vision, vomiting, nausea, or focal weakness/numbness.  No fevers. Has had some left trapezius/lateral neck pain since yesterday. Some occasional radiation down her left arm. Patient also has had urinary frequency without dysuria for the last 2 days. No increased thirst.  Past Medical History:  Diagnosis Date  . Ovarian cyst   . Reflux     There are no active problems to display for this patient.   History reviewed. No pertinent surgical history.  OB History    Gravida Para Term Preterm AB Living   1             SAB TAB Ectopic Multiple Live Births                   Home Medications    Prior to Admission medications   Medication Sig Start Date End Date Taking? Authorizing Provider  Aspirin-Salicylamide-Caffeine (BC HEADACHE PO) Take 1 packet by mouth daily as needed (headache).   Yes [provider]  Hyoscyamine Sulfate SL (LEVSIN/SL) 0.125 MG SUBL Place 1 tablet under the tongue every 4 (four) hours as needed. Patient taking differently: Place 1 tablet under the tongue every 4 (four) hours as needed (indigestion).  05/11/17  Yes Deatra Canter, FNP  metFORMIN (GLUCOPHAGE) 500 MG tablet metformin 500 mg tablet  1 po qday x 10-14 days, then increase to 500mg  po bid for 10-14 days, then increase to 1000 qAM and 500qpm x 10-14 days, then increase to 1000mg  bid   Yes [provider]    ibuprofen (ADVIL,MOTRIN) 600 MG tablet Take 1 tablet (600 mg total) by mouth every 6 (six) hours as needed. Patient not taking: Reported on 07/04/2017 03/25/16   Eyvonne Mechanic, PA-C  ipratropium (ATROVENT) 0.06 % nasal spray Place 2 sprays into both nostrils 4 (four) times daily. Patient not taking: Reported on 07/04/2017 06/25/16   Linna Hoff, MD  nitrofurantoin, macrocrystal-monohydrate, (MACROBID) 100 MG capsule Take 1 capsule (100 mg total) by mouth 2 (two) times daily. 07/04/17 07/09/17  Pricilla Loveless, MD  omeprazole (PRILOSEC) 20 MG capsule Take 1 capsule (20 mg total) by mouth 2 (two) times daily before a meal. Patient not taking: Reported on 07/04/2017 10/30/16   Charm Rings, MD  promethazine (PHENERGAN) 25 MG tablet Take 1 tablet (25 mg total) by mouth every 6 (six) hours as needed for nausea (Nor V.). May cause drowsiness. Patient not taking: Reported on 07/04/2017 05/16/17   Hayden Rasmussen, NP  senna (SENOKOT) 8.6 MG tablet Take 2 tablets (17.2 mg total) by mouth daily. Patient not taking: Reported on 07/04/2017 10/30/16   Charm Rings, MD  sucralfate (CARAFATE) 1 g tablet Take 1 tablet (1 g total) by mouth 4 (four) times daily -  with meals and at bedtime. Patient not taking: Reported on 07/04/2017 05/16/17   Mabe,  Onalee Hua, NP    Family History History reviewed. No pertinent family history.  Social History Social History  Substance Use Topics  . Smoking status: Never Smoker  . Smokeless tobacco: Never Used  . Alcohol use No     Allergies   Patient has no known allergies.   Review of Systems Review of Systems  Constitutional: Negative for fever.  Eyes: Negative for visual disturbance.  Gastrointestinal: Negative for abdominal pain, nausea and vomiting.  Genitourinary: Positive for frequency. Negative for dysuria.  Musculoskeletal: Positive for neck pain. Negative for neck stiffness.  Neurological: Positive for numbness. Negative for dizziness, weakness and headaches.  All other  systems reviewed and are negative.    Physical Exam Updated Vital Signs BP 109/68   Pulse 84   Temp 97.9 F (36.6 C) (Oral)   Resp 17   SpO2 97%   Physical Exam  Constitutional: She is oriented to person, place, and time. She appears well-developed and well-nourished. No distress.  HENT:  Head: Normocephalic and atraumatic.  Right Ear: External ear normal.  Left Ear: External ear normal.  Nose: Nose normal.  Normal sensation to scalp/forehead bilaterally. No rashes  Eyes: EOM are normal. Pupils are equal, round, and reactive to light. Right eye exhibits no discharge. Left eye exhibits no discharge.  Neck: Normal range of motion. Neck supple.  Cardiovascular: Normal rate, regular rhythm and normal heart sounds.   Pulmonary/Chest: Effort normal and breath sounds normal.  Abdominal: Soft. There is no tenderness.  Neurological: She is alert and oriented to person, place, and time.  CN 3-12 grossly intact. 5/5 strength in all 4 extremities. Grossly normal sensation. Normal finger to nose.   Skin: Skin is warm and dry. She is not diaphoretic.  Nursing note and vitals reviewed.    ED Treatments / Results  Labs (all labs ordered are listed, but only abnormal results are displayed) Labs Reviewed  URINALYSIS, ROUTINE W REFLEX MICROSCOPIC - Abnormal; Notable for the following:       Result Value   APPearance CLOUDY (*)    Hgb urine dipstick SMALL (*)    Leukocytes, UA LARGE (*)    Bacteria, UA RARE (*)    Squamous Epithelial / LPF 6-30 (*)    All other components within normal limits  URINE CULTURE  POC URINE PREG, ED  I-STAT CHEM 8, ED    EKG  EKG Interpretation None       Radiology No results found.  Procedures Procedures (including critical care time)  Medications Ordered in ED Medications - No data to display   Initial Impression / Assessment and Plan / ED Course  I have reviewed the triage vital signs and the nursing notes.  Pertinent labs & imaging  results that were available during my care of the patient were reviewed by me and considered in my medical decision making (see chart for details).     Unclear what is causing this cold sensation to the left side of her scalp. However her neurologic exam including sensation is normal. There is no obvious rash. She has no headache, nausea, vomiting, vision symptoms. I highly doubt acute neurologic emergency such as meningitis, encephalitis, stroke, head bleed, mass. I do not think emergent imaging is needed. This is possibly a peripheral neuropathy given it is only one-sided but it is also not reproducible. Electrolyte are unremarkable. I think this is a separate issue but she also appears to have a UTI with urinary frequency. She will be treated with Macrobid. At  this point she is telling me that her abnormal sensation is improving and. Plan to have her follow-up with her PCP if no improvement. Discussed return precautions.  Final diagnoses:  Cold sensation of skin  Acute UTI    New Prescriptions New Prescriptions   NITROFURANTOIN, MACROCRYSTAL-MONOHYDRATE, (MACROBID) 100 MG CAPSULE    Take 1 capsule (100 mg total) by mouth 2 (two) times daily.     Pricilla LovelessGoldston, Fannie Alomar, MD 07/04/17 [id number]

## 2017-07-06 LAB — URINE CULTURE: Culture: <10000 — AB

## 2017-07-22 DIAGNOSIS — Z113 Encounter for screening for infections with a predominantly sexual mode of transmission: Secondary | ICD-10-CM | POA: Diagnosis not present

## 2017-07-22 DIAGNOSIS — Z6836 Body mass index (BMI) 36.0-36.9, adult: Secondary | ICD-10-CM | POA: Diagnosis not present

## 2017-07-22 DIAGNOSIS — Z01419 Encounter for gynecological examination (general) (routine) without abnormal findings: Secondary | ICD-10-CM | POA: Diagnosis not present

## 2017-07-22 DIAGNOSIS — N926 Irregular menstruation, unspecified: Secondary | ICD-10-CM | POA: Diagnosis not present

## 2017-07-22 DIAGNOSIS — A5900 Urogenital trichomoniasis, unspecified: Secondary | ICD-10-CM | POA: Diagnosis not present

## 2017-08-28 ENCOUNTER — Encounter (HOSPITAL_COMMUNITY): Payer: Self-pay | Admitting: Nurse Practitioner

## 2017-08-28 ENCOUNTER — Ambulatory Visit (HOSPITAL_COMMUNITY)
Admission: EM | Admit: 2017-08-28 | Discharge: 2017-08-28 | Disposition: A | Discharge status: Home / Self Care | Payer: Medicare Other | Attending: Family Medicine | Admitting: Family Medicine

## 2017-08-28 DIAGNOSIS — R1310 Dysphagia, unspecified: Secondary | ICD-10-CM

## 2017-08-28 DIAGNOSIS — K2100 Gastro-esophageal reflux disease with esophagitis, without bleeding: Secondary | ICD-10-CM

## 2017-08-28 MED ORDER — OMEPRAZOLE 40 MG PO CPDR: 40.0000 mg | DELAYED_RELEASE_CAPSULE | Freq: Every day | ORAL | 0 refills | Status: DC

## 2017-08-28 MED ORDER — RANITIDINE HCL 150 MG PO TABS: 150.0000 mg | ORAL_TABLET | Freq: Two times a day (BID) | ORAL | 0 refills | Status: DC

## 2017-08-28 NOTE — Discharge Instructions (Signed)
If symptoms worsen then can follow up with pcp.  If unable to swallow or having to spit up then go to ED

## 2017-08-28 NOTE — ED Provider Notes (Addendum)
[facility]   [id number] 08/28/17 Arrival Time: 1906  ASSESSMENT & PLAN:  1. Gastroesophageal reflux disease with esophagitis   2. Dysphagia, unspecified type     Meds ordered this encounter  Medications  . ranitidine (ZANTAC) 150 MG tablet    Sig: Take 1 tablet (150 mg total) by mouth 2 (two) times daily.    Dispense:  28 tablet    Refill:  0    Order Specific Question:   Supervising Provider    Answer:   Mardella LaymanHAGLER, BRIAN [id number]]  . omeprazole (PRILOSEC) 40 MG capsule    Sig: Take 1 capsule (40 mg total) by mouth daily.    Dispense:  28 capsule    Refill:  0    Order Specific Question:   Supervising Provider    Answer:   Mardella LaymanHAGLER, BRIAN [[id number]]    Reviewed expectations re: course of current medical issues. Questions answered. Outlined signs and symptoms indicating need for more acute intervention. Patient verbalized understanding. After Visit Summary given.   SUBJECTIVE:  Mariah Rosales is a 31 y.o. female who presents with complaint of GERD and dysphasia. She is taking omeprazole.  She has a globulus sensation. ROS: As per HPI.   OBJECTIVE:  Vitals:   08/28/17 1954  BP: 102/69  Pulse: 90  Resp: 16  Temp: 98 F (36.7 C)  TempSrc: Oral  SpO2: 100%    General appearance: alert; no distress Eyes: PERRLA; EOMI; conjunctiva normal HENT: normocephalic; atraumatic; TMs normal; nasal mucosa normal; oral mucosa normal Neck: supple Lungs: clear to auscultation bilaterally Heart: regular rate and rhythm Abdomen: soft, non-tender; bowel sounds normal; no masses or organomegaly; no guarding or rebound tenderness Back: no CVA tenderness Extremities: no cyanosis or edema; symmetrical with no gross deformities Skin: warm and dry Neurologic: normal gait; normal symmetric reflexes Psychological: alert and cooperative; normal mood and affect  Labs: Results for orders placed or performed during the hospital encounter of 07/04/17  Urine culture  Result Value  Ref Range   Specimen Description URINE, RANDOM    Special Requests NONE    Culture (A)     <10,000 COLONIES/mL INSIGNIFICANT GROWTH Performed at [facility], [address] [place], [place] [address]    Report Status 07/06/2017 FINAL   Urinalysis, Routine w reflex microscopic  Result Value Ref Range   Color, Urine YELLOW YELLOW   APPearance CLOUDY (A) CLEAR   Specific Gravity, Urine 1.024 1.005 - 1.030   pH 6.0 5.0 - 8.0   Glucose, UA NEGATIVE NEGATIVE mg/dL   Hgb urine dipstick SMALL (A) NEGATIVE   Bilirubin Urine NEGATIVE NEGATIVE   Ketones, ur NEGATIVE NEGATIVE mg/dL   Protein, ur NEGATIVE NEGATIVE mg/dL   Nitrite NEGATIVE NEGATIVE   Leukocytes, UA LARGE (A) NEGATIVE   RBC / HPF 6-30 0 - 5 RBC/hpf   WBC, UA 6-30 0 - 5 WBC/hpf   Bacteria, UA RARE (A) NONE SEEN   Squamous Epithelial / LPF 6-30 (A) NONE SEEN   Mucus PRESENT   POC urine preg, ED  Result Value Ref Range   Preg Test, Ur NEGATIVE NEGATIVE  I-stat Chem 8, ED  Result Value Ref Range   Sodium 141 135 - 145 mmol/L   Potassium 3.5 3.5 - 5.1 mmol/L   Chloride 104 101 - 111 mmol/L   BUN 10 6 - 20 mg/dL   Creatinine, Ser 9.560.70 0.44 - 1.00 mg/dL   Glucose, Bld 99 65 - 99 mg/dL   Calcium, Ion 2.131.28  1.15 - 1.40 mmol/L   TCO2 28 0 - 100 mmol/L   Hemoglobin 13.3 12.0 - 15.0 g/dL   HCT 40.9 81.1 - 91.4 %   Labs Reviewed - No data to display  Imaging: No results found.  No Known Allergies  Past Medical History:  Diagnosis Date  . Ovarian cyst   . Reflux    Social History   Social History  . Marital status: Single    Spouse name: N/A  . Number of children: N/A  . Years of education: N/A   Occupational History  . Not on file.   Social History Main Topics  . Smoking status: Never Smoker  . Smokeless tobacco: Never Used  . Alcohol use No  . Drug use: No  . Sexual activity: Not on file   Other Topics Concern  . Not on file   Social History Narrative  . No narrative on file   History  reviewed. No pertinent family history. History reviewed. No pertinent surgical history.   Deatra Canter, FNP 08/28/17 2026    Deatra Canter, FNP 08/28/17 2026

## 2017-08-28 NOTE — ED Notes (Signed)
Patient denies pain and is resting comfortably.  

## 2017-08-28 NOTE — ED Triage Notes (Addendum)
Pt presents with c/o acid reflux. Her symptoms began about a year ago. She feels like food gets stuck in her throat and she cant swallow it. She reports nausea, belching. Shes been taking omeprazole for the past year with no improvement.

## 2017-09-19 DIAGNOSIS — N926 Irregular menstruation, unspecified: Secondary | ICD-10-CM | POA: Diagnosis not present

## 2017-09-19 DIAGNOSIS — N939 Abnormal uterine and vaginal bleeding, unspecified: Secondary | ICD-10-CM | POA: Diagnosis not present

## 2017-10-01 DIAGNOSIS — M26622 Arthralgia of left temporomandibular joint: Secondary | ICD-10-CM | POA: Diagnosis not present

## 2017-10-01 DIAGNOSIS — Z6836 Body mass index (BMI) 36.0-36.9, adult: Secondary | ICD-10-CM | POA: Diagnosis not present

## 2017-10-01 DIAGNOSIS — L2089 Other atopic dermatitis: Secondary | ICD-10-CM | POA: Diagnosis not present

## 2017-10-16 ENCOUNTER — Ambulatory Visit: Payer: Self-pay | Admitting: Psychology

## 2017-10-17 DIAGNOSIS — N926 Irregular menstruation, unspecified: Secondary | ICD-10-CM | POA: Diagnosis not present

## 2017-10-22 ENCOUNTER — Ambulatory Visit: Payer: Self-pay | Admitting: Dietician

## 2017-10-23 IMAGING — DX DG CHEST 2V
2 series · 2 of 2 positions shown · non-contrast
Comparison: 04/08/2014

CLINICAL DATA: Chest discomfort and tightness on the left side
radiating to the shoulders. Anxiety.

EXAM:
CHEST  2 VIEW

[chest pa]
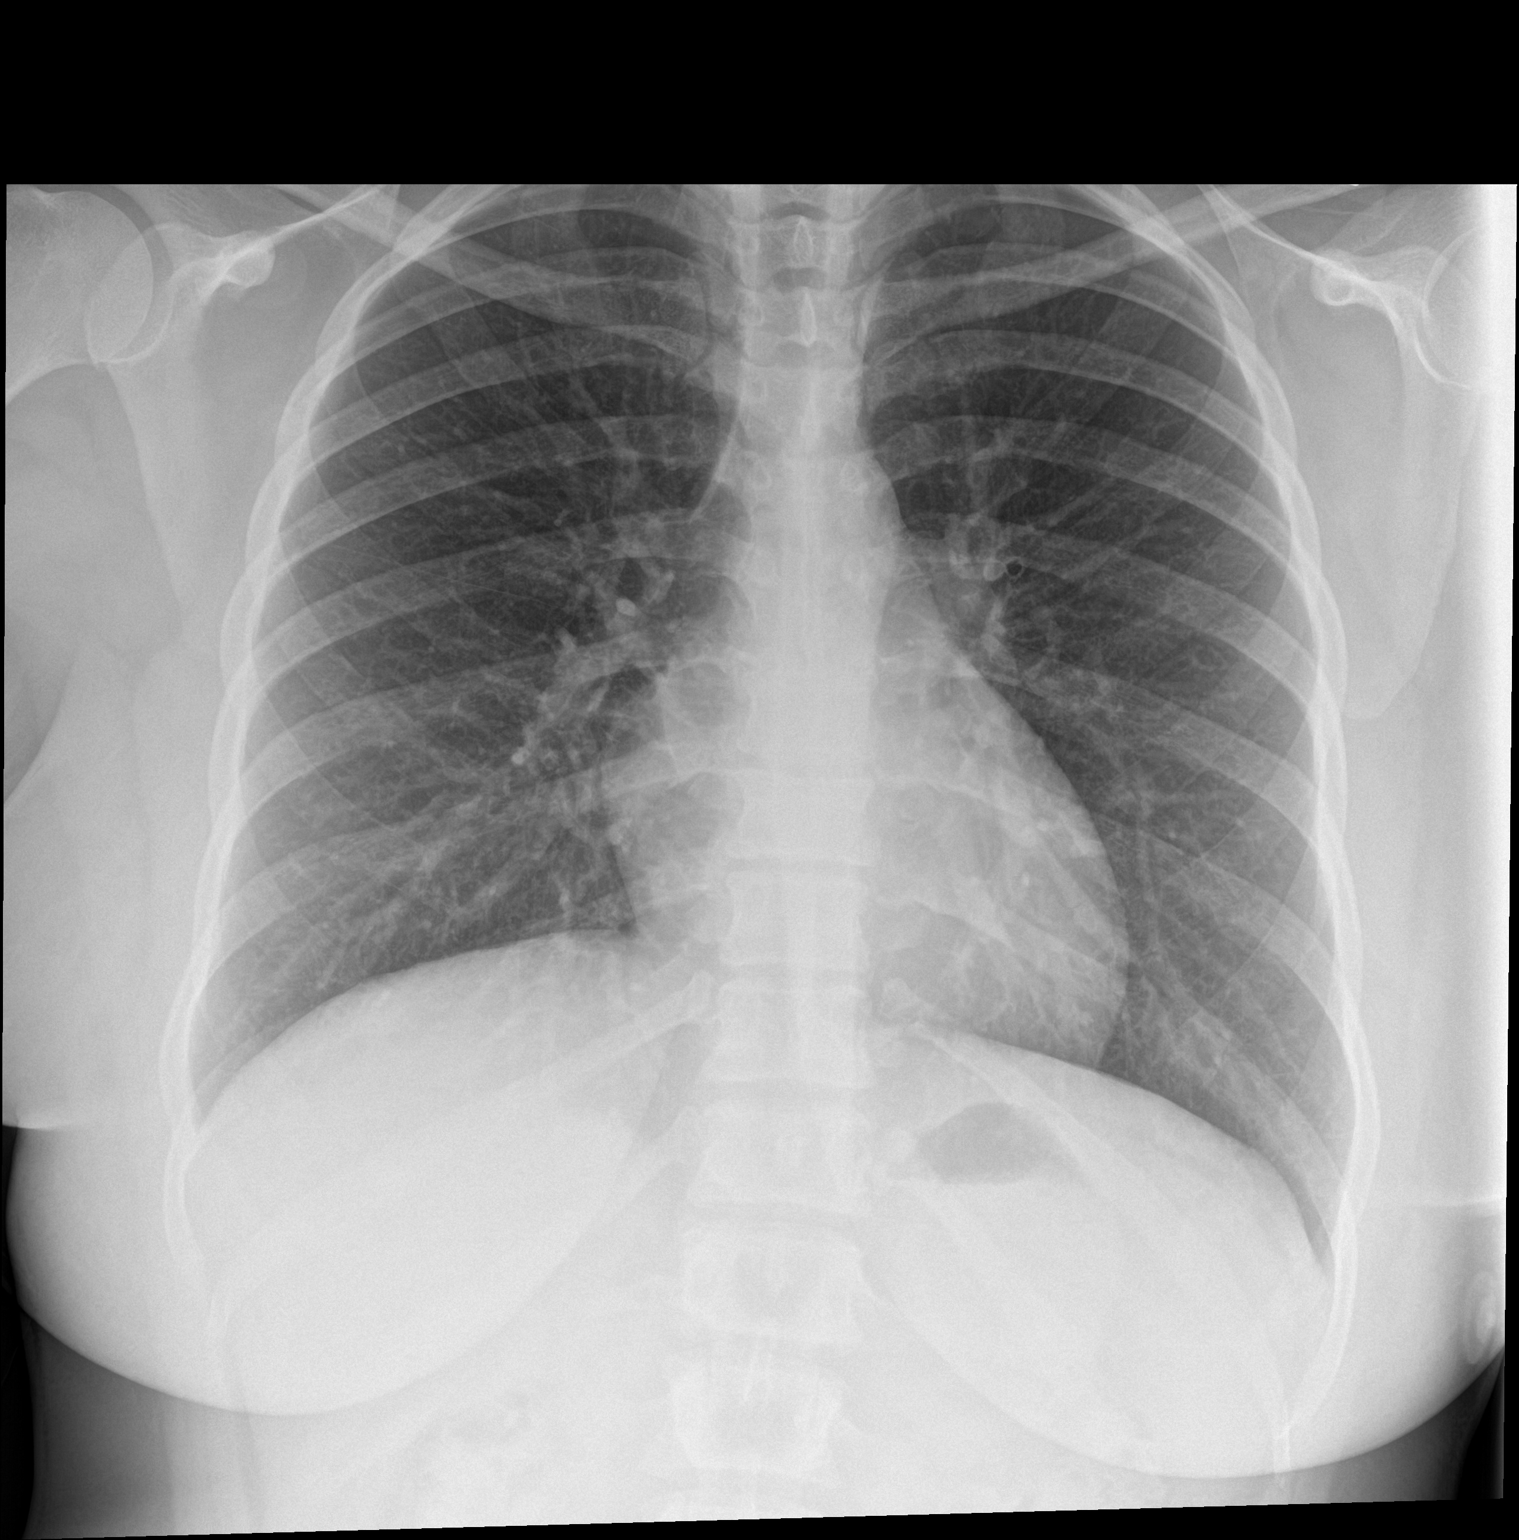

[chest lat]
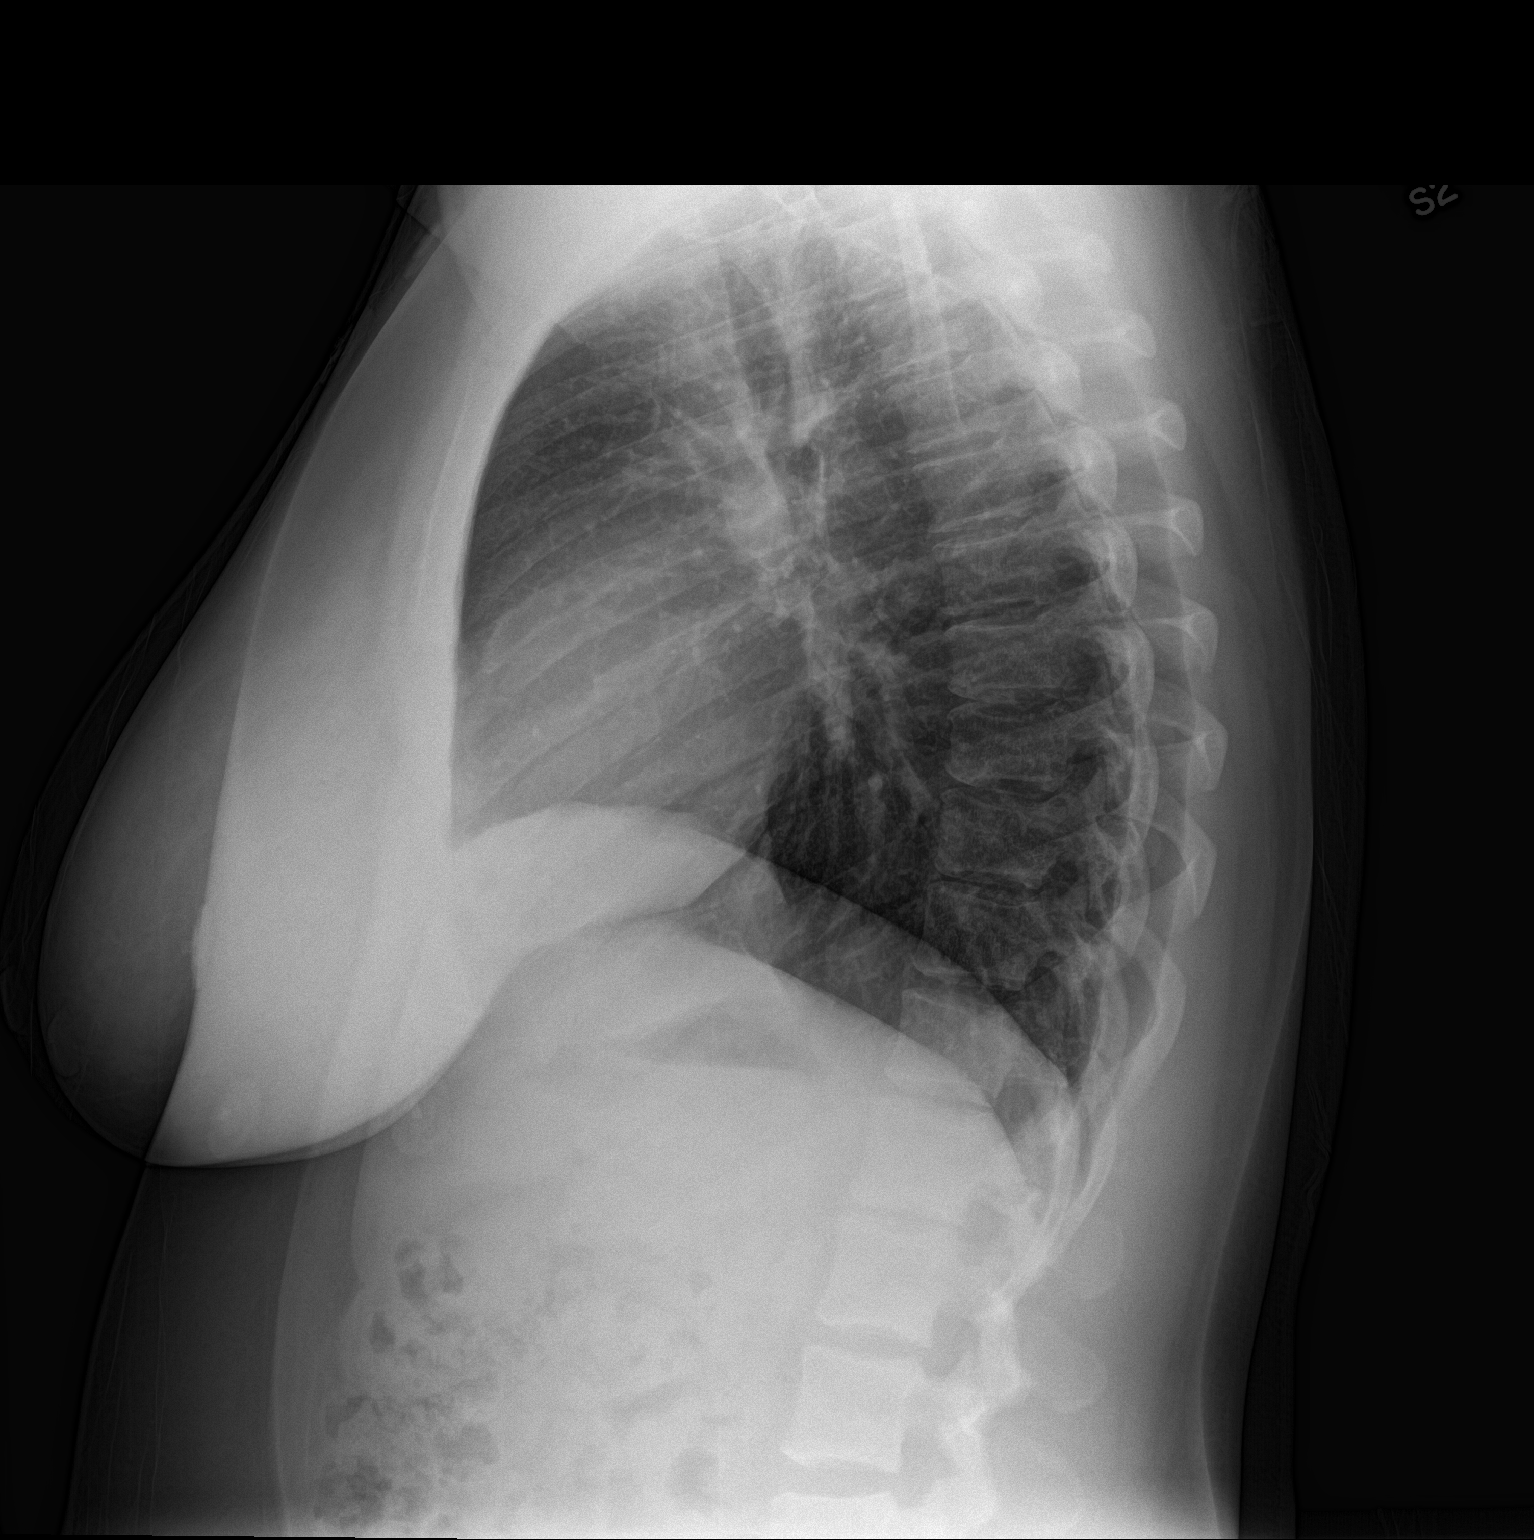

[2 of 2 positions shown; findings below may reference images not displayed]

FINDINGS: The heart size and mediastinal contours are within normal limits.
Both lungs are clear. The visualized skeletal structures are
unremarkable.
IMPRESSION: No active cardiopulmonary disease.

## 2017-10-31 ENCOUNTER — Encounter: Payer: Self-pay | Admitting: Dietician

## 2017-11-03 DIAGNOSIS — E282 Polycystic ovarian syndrome: Secondary | ICD-10-CM | POA: Diagnosis not present

## 2017-11-03 DIAGNOSIS — N915 Oligomenorrhea, unspecified: Secondary | ICD-10-CM | POA: Diagnosis not present

## 2018-07-27 DIAGNOSIS — Z6834 Body mass index (BMI) 34.0-34.9, adult: Secondary | ICD-10-CM | POA: Diagnosis not present

## 2018-07-27 DIAGNOSIS — Z113 Encounter for screening for infections with a predominantly sexual mode of transmission: Secondary | ICD-10-CM | POA: Diagnosis not present

## 2018-07-27 DIAGNOSIS — Z3009 Encounter for other general counseling and advice on contraception: Secondary | ICD-10-CM | POA: Diagnosis not present

## 2018-07-28 IMAGING — DX DG ANKLE COMPLETE 3+V*L*
3 series · 3 of 3 positions shown · non-contrast
Comparison: None.

CLINICAL DATA: Twisting injury while walking with lateral ankle
pain, initial encounter

EXAM:
LEFT ANKLE COMPLETE - 3+ VIEW

[ankle ap]
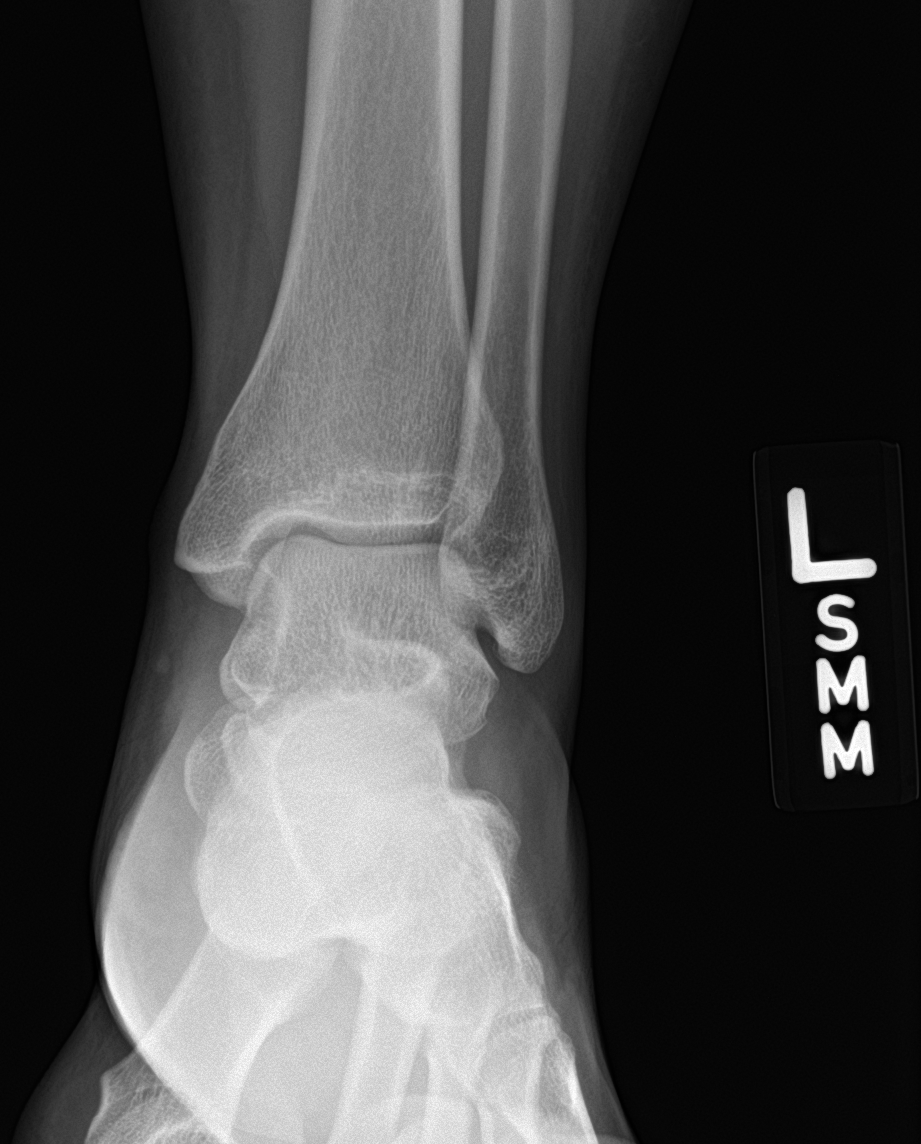

[ankle obl]
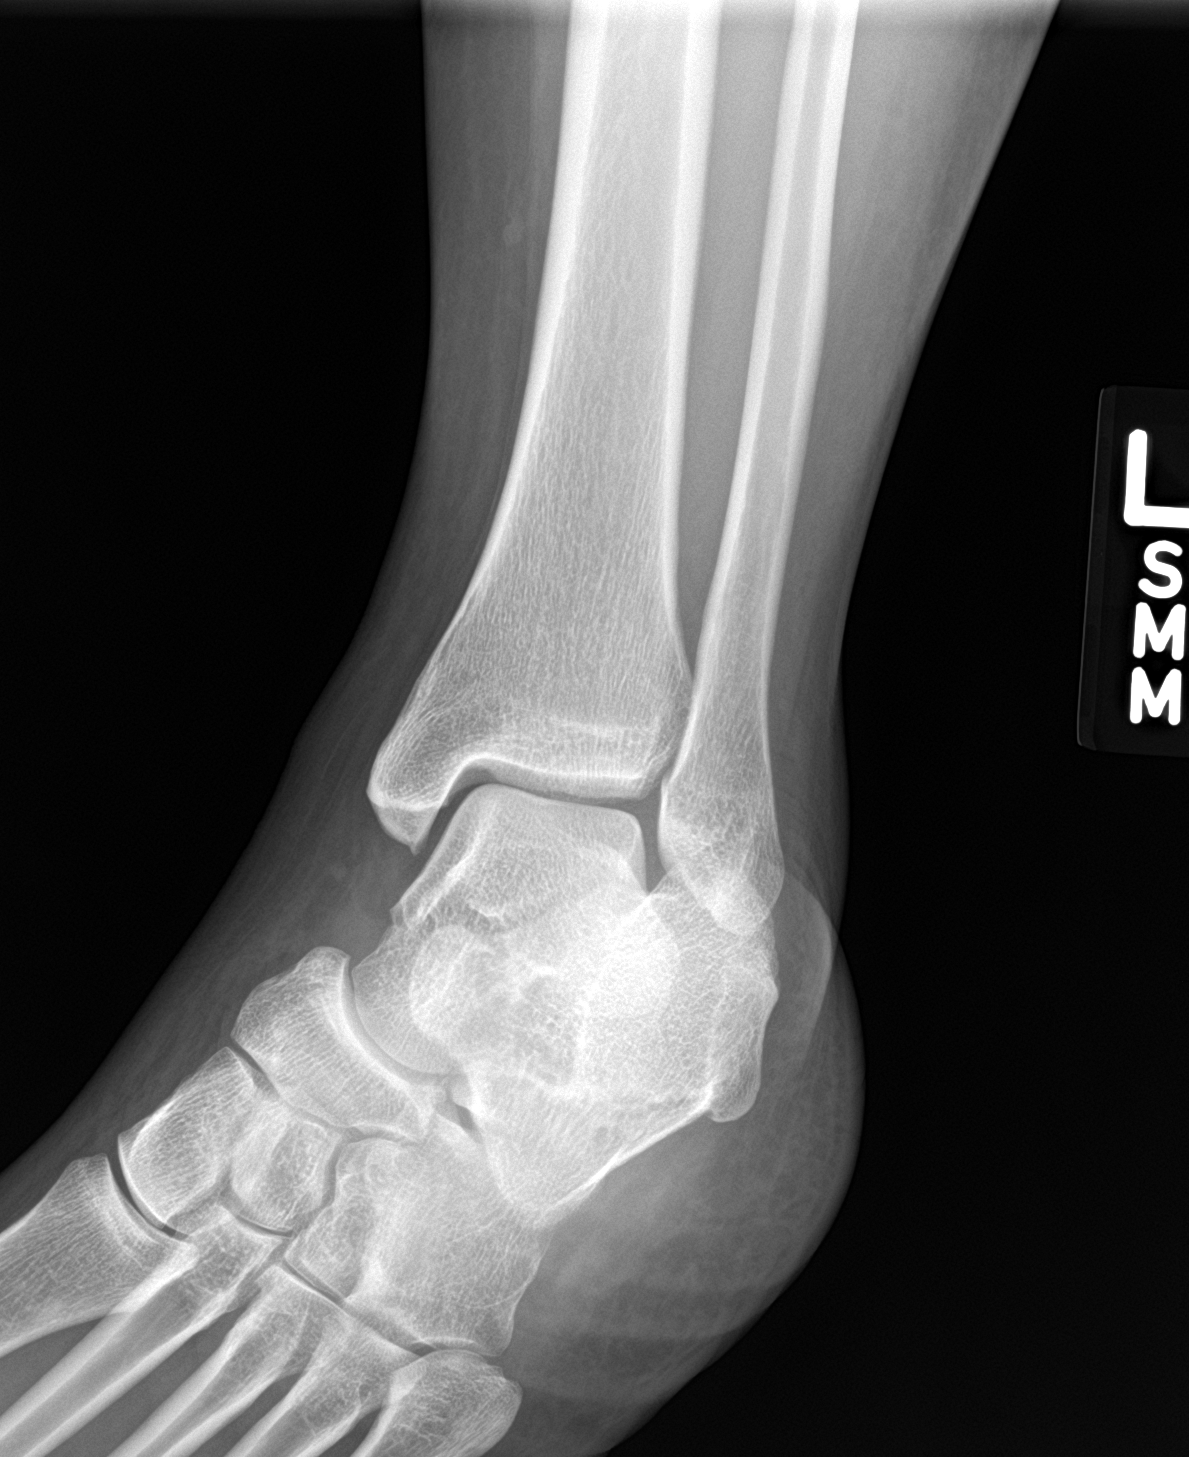

[ankle lat]
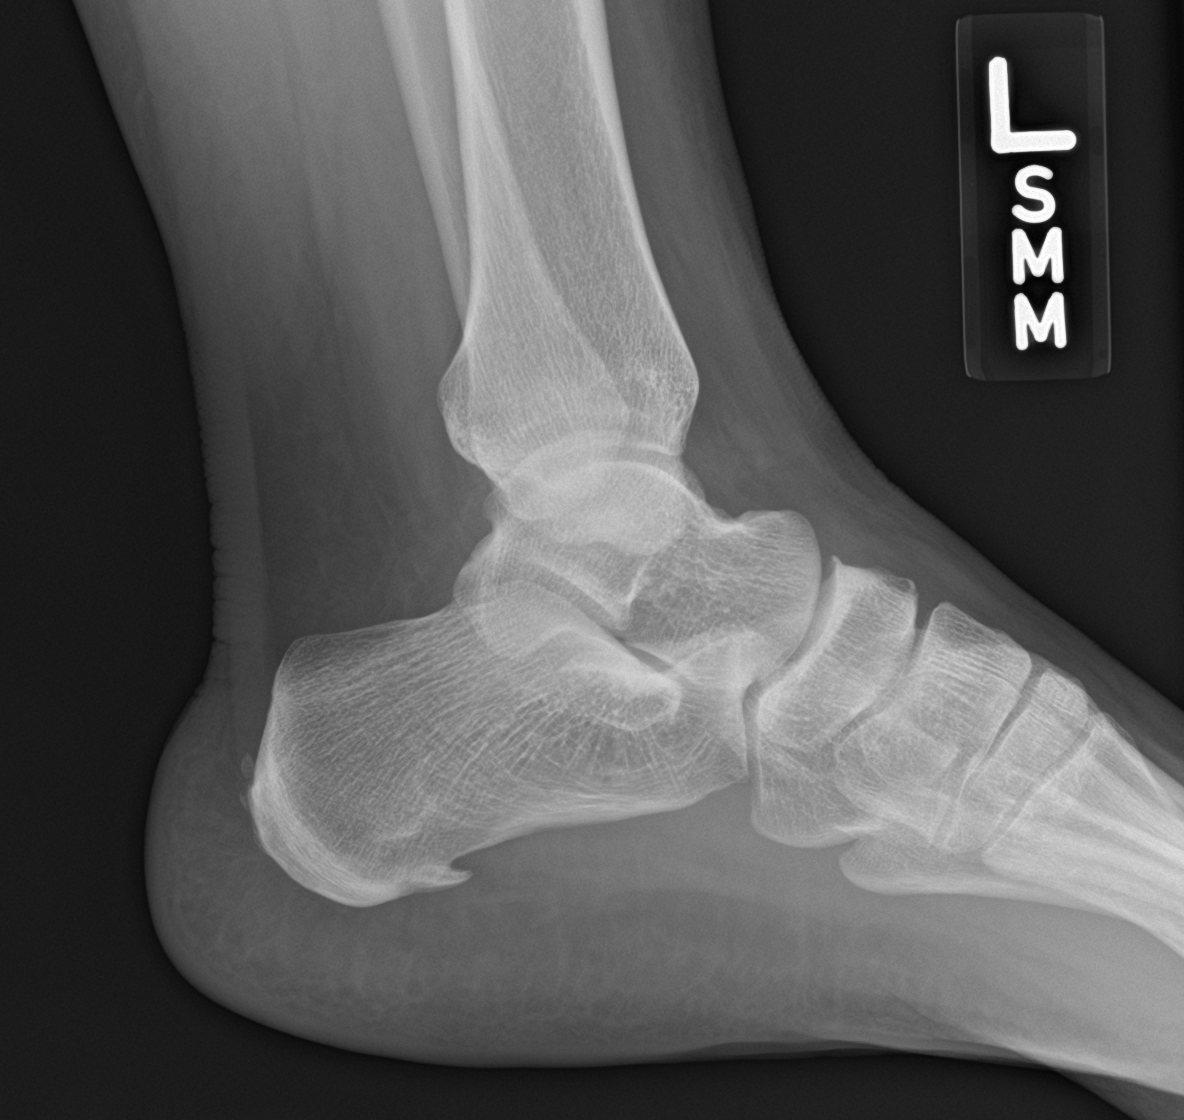

[3 of 3 positions shown; findings below may reference images not displayed]

FINDINGS: There is no evidence of fracture, dislocation, or joint effusion.
There is no evidence of arthropathy or other focal bone abnormality.
Soft tissues are unremarkable.
IMPRESSION: No acute abnormality noted.

## 2018-09-18 ENCOUNTER — Ambulatory Visit (HOSPITAL_COMMUNITY)
Admission: EM | Admit: 2018-09-18 | Discharge: 2018-09-18 | Disposition: A | Discharge status: Home / Self Care | Payer: Medicare Other | Attending: Internal Medicine | Admitting: Internal Medicine

## 2018-09-18 ENCOUNTER — Encounter (HOSPITAL_COMMUNITY): Payer: Self-pay | Admitting: Emergency Medicine

## 2018-09-18 DIAGNOSIS — Z7984 Long term (current) use of oral hypoglycemic drugs: Secondary | ICD-10-CM | POA: Diagnosis not present

## 2018-09-18 DIAGNOSIS — N760 Acute vaginitis: Secondary | ICD-10-CM

## 2018-09-18 DIAGNOSIS — B9689 Other specified bacterial agents as the cause of diseases classified elsewhere: Secondary | ICD-10-CM | POA: Insufficient documentation

## 2018-09-18 DIAGNOSIS — K219 Gastro-esophageal reflux disease without esophagitis: Secondary | ICD-10-CM | POA: Diagnosis not present

## 2018-09-18 DIAGNOSIS — N83209 Unspecified ovarian cyst, unspecified side: Secondary | ICD-10-CM | POA: Diagnosis not present

## 2018-09-18 DIAGNOSIS — Z7982 Long term (current) use of aspirin: Secondary | ICD-10-CM | POA: Insufficient documentation

## 2018-09-18 DIAGNOSIS — Z79899 Other long term (current) drug therapy: Secondary | ICD-10-CM | POA: Diagnosis not present

## 2018-09-18 DIAGNOSIS — N898 Other specified noninflammatory disorders of vagina: Secondary | ICD-10-CM | POA: Diagnosis present

## 2018-09-18 LAB — POCT URINALYSIS DIP (DEVICE)
BILIRUBIN URINE: NEGATIVE
Glucose, UA: NEGATIVE mg/dL
KETONES UR: NEGATIVE mg/dL
NITRITE: NEGATIVE
PH: 7 (ref 5.0–8.0)
Protein, ur: NEGATIVE mg/dL
Specific Gravity, Urine: 1.025 (ref 1.005–1.030)
UROBILINOGEN UA: 2 mg/dL — AB (ref 0.0–1.0)

## 2018-09-18 LAB — POCT PREGNANCY, URINE: PREG TEST UR: NEGATIVE

## 2018-09-18 MED ORDER — METRONIDAZOLE 500 MG PO TABS: 500.0000 mg | ORAL_TABLET | Freq: Two times a day (BID) | ORAL | 0 refills | Status: AC

## 2018-09-18 MED ORDER — FLUCONAZOLE 150 MG PO TABS: 150.0000 mg | ORAL_TABLET | Freq: Once | ORAL | 0 refills | Status: AC

## 2018-09-18 NOTE — Discharge Instructions (Signed)
We will go ahead and treat you for yeast and BV, please take 1 tablet of Diflucan today, then to begin taking metronidazole twice daily with food for the next week.  Please do not drink alcohol until 12 after 24 hours after taking the last tablet.  Please refrain from sexual intercourse for 7 days while medicines eliminating infection.   We are testing you for Gonorrhea, Chlamydia, Trichomonas, Yeast and Bacterial Vaginosis. We will call you if anything is positive and let you know if you require any further treatment. Please inform partners of any positive results.   Please return if symptoms not improving with treatment, development of fever, nausea, vomiting, abdominal pain.

## 2018-09-18 NOTE — ED Provider Notes (Signed)
[facility]    CSN: [id number] Arrival date & time: 09/18/18  1357     History   Chief Complaint Vaginal itching and irritation  HPI Mariah Rosales is a 32 y.o. female history of ovarian cyst, GERD presenting today for evaluation of vaginal irritation and itching.  Patient states that for the past 2 to 3 weeks she has had irritation and itching.  She has also had a white thick discharge, at times it has also been thin and watery.  She has been using Vagisil without relief.  She has a history of both yeast and BV.  Last menstrual period was approximately 2 weeks ago.  She has not on any form of birth control.  Patient states that she does a lot of work that causes her to sweat.  HPI  Past Medical History:  Diagnosis Date  . Ovarian cyst   . Reflux     There are no active problems to display for this patient.   History reviewed. No pertinent surgical history.  OB History    Gravida  1   Para      Term      Preterm      AB      Living        SAB      TAB      Ectopic      Multiple      Live Births               Home Medications    Prior to Admission medications   Medication Sig Start Date End Date Taking? Authorizing Provider  Aspirin-Salicylamide-Caffeine (BC HEADACHE PO) Take 1 packet by mouth daily as needed (headache).    [provider]  fluconazole (DIFLUCAN) 150 MG tablet Take 1 tablet (150 mg total) by mouth once for 1 dose. 09/18/18 09/18/18  Lauralyn Shadowens C, PA-C  Hyoscyamine Sulfate SL (LEVSIN/SL) 0.125 MG SUBL Place 1 tablet under the tongue every 4 (four) hours as needed. Patient taking differently: Place 1 tablet under the tongue every 4 (four) hours as needed (indigestion).  05/11/17   Deatra Canter, FNP  metFORMIN (GLUCOPHAGE) 500 MG tablet metformin 500 mg tablet  1 po qday x 10-14 days, then increase to 500mg  po bid for 10-14 days, then increase to 1000 qAM and 500qpm x 10-14 days, then increase to 1000mg  bid     [provider]  metroNIDAZOLE (FLAGYL) 500 MG tablet Take 1 tablet (500 mg total) by mouth 2 (two) times daily for 7 days. 09/18/18 09/25/18  Asuka Dusseau C, PA-C  diphenhydrAMINE (BENYLIN) 12.5 MG/5ML syrup Take 10 mLs (25 mg total) by mouth 4 (four) times daily. 06/15/14 03/28/15  Ivonne Andrew, PA-C    Family History No family history on file.  Social History Social History   Tobacco Use  . Smoking status: Never Smoker  . Smokeless tobacco: Never Used  Substance Use Topics  . Alcohol use: No  . Drug use: No     Allergies   Patient has no known allergies.   Review of Systems Review of Systems  Constitutional: Negative for fever.  Respiratory: Negative for shortness of breath.   Cardiovascular: Negative for chest pain.  Gastrointestinal: Negative for abdominal pain, diarrhea, nausea and vomiting.  Genitourinary: Positive for vaginal discharge. Negative for dysuria, flank pain, genital sores, hematuria, menstrual problem, vaginal bleeding and vaginal pain.  Musculoskeletal: Negative for back pain.  Skin: Negative for rash.  Neurological: Negative for  dizziness, light-headedness and headaches.     Physical Exam Triage Vital Signs ED Triage Vitals [09/18/18 1400]  Enc Vitals Group     BP 112/73     Pulse Rate 77     Resp 16     Temp 98.5 F (36.9 C)     Temp Source Oral     SpO2 97 %     Weight      Height      Head Circumference      Peak Flow      Pain Score      Pain Loc      Pain Edu?      Excl. in GC?    No data found.  Updated Vital Signs BP 112/73 (BP Location: Left Arm)   Pulse 77   Temp 98.5 F (36.9 C) (Oral)   Resp 16   LMP 09/09/2018   SpO2 97%   Visual Acuity Right Eye Distance:   Left Eye Distance:   Bilateral Distance:    Right Eye Near:   Left Eye Near:    Bilateral Near:     Physical Exam  Constitutional: She is oriented to person, place, and time. She appears well-developed and well-nourished.  No acute distress   HENT:  Head: Normocephalic and atraumatic.  Nose: Nose normal.  Eyes: Conjunctivae are normal.  Neck: Neck supple.  Cardiovascular: Normal rate.  Pulmonary/Chest: Effort normal. No respiratory distress.  Abdominal: Soft. She exhibits no distension. There is no tenderness.  Nontender to light deep palpation throughout all 4 quadrants and suprapubic area, negative CVA tenderness  Musculoskeletal: Normal range of motion.  Neurological: She is alert and oriented to person, place, and time.  Skin: Skin is warm and dry.  Psychiatric: She has a normal mood and affect.  Nursing note and vitals reviewed.    UC Treatments / Results  Labs (all labs ordered are listed, but only abnormal results are displayed) Labs Reviewed  CERVICOVAGINAL ANCILLARY ONLY    EKG None  Radiology No results found.  Procedures Procedures (including critical care time)  Medications Ordered in UC Medications - No data to display  Initial Impression / Assessment and Plan / UC Course  I have reviewed the triage vital signs and the nursing notes.  Pertinent labs & imaging results that were available during my care of the patient were reviewed by me and considered in my medical decision making (see chart for details).     Pregnancy test negative, large leuks, culture obtained.  Vaginal swab obtained to confirm yeast, BV versus other STDs.  We will go ahead and begin empiric treatment for both yeast and BV.  Diflucan and metronidazole provided.  Will call patient with results and alter treatment as needed.Discussed strict return precautions. Patient verbalized understanding and is agreeable with plan.  Final Clinical Impressions(s) / UC Diagnoses   Final diagnoses:  Acute vaginitis     Discharge Instructions     We will go ahead and treat you for yeast and BV, please take 1 tablet of Diflucan today, then to begin taking metronidazole twice daily with food for the next week.  Please do not drink  alcohol until 12 after 24 hours after taking the last tablet.  Please refrain from sexual intercourse for 7 days while medicines eliminating infection.   We are testing you for Gonorrhea, Chlamydia, Trichomonas, Yeast and Bacterial Vaginosis. We will call you if anything is positive and let you know if you require any further treatment.  Please inform partners of any positive results.   Please return if symptoms not improving with treatment, development of fever, nausea, vomiting, abdominal pain.    ED Prescriptions    Medication Sig Dispense Auth. Provider   fluconazole (DIFLUCAN) 150 MG tablet Take 1 tablet (150 mg total) by mouth once for 1 dose. 2 tablet Faryal Marxen C, PA-C   metroNIDAZOLE (FLAGYL) 500 MG tablet Take 1 tablet (500 mg total) by mouth 2 (two) times daily for 7 days. 14 tablet Derricka Mertz, [place] C, PA-C     Controlled Substance Prescriptions [place] Controlled Substance Registry consulted? Not Applicable   Lew Dawes, [place] 09/19/18 [phone]

## 2018-09-18 NOTE — ED Triage Notes (Signed)
Pt c/o vaginal irritation and itching, thinks shes using a soap that's irritating it. X3 weeks.

## 2018-09-20 LAB — URINE CULTURE

## 2018-09-21 LAB — POCT PREGNANCY, URINE: Preg Test, Ur: NEGATIVE

## 2018-09-22 LAB — CERVICOVAGINAL ANCILLARY ONLY
Bacterial vaginitis: NEGATIVE
Candida vaginitis: POSITIVE — AB
Chlamydia: NEGATIVE
Neisseria Gonorrhea: NEGATIVE
Trichomonas: NEGATIVE

## 2018-10-19 DIAGNOSIS — Z79899 Other long term (current) drug therapy: Secondary | ICD-10-CM | POA: Diagnosis not present

## 2018-10-19 DIAGNOSIS — F3181 Bipolar II disorder: Secondary | ICD-10-CM | POA: Diagnosis not present

## 2018-10-20 ENCOUNTER — Ambulatory Visit: Payer: Self-pay | Admitting: Family Medicine

## 2018-10-22 ENCOUNTER — Encounter: Payer: Self-pay | Admitting: Family Medicine

## 2018-10-22 ENCOUNTER — Ambulatory Visit (INDEPENDENT_AMBULATORY_CARE_PROVIDER_SITE_OTHER): Payer: Medicare Other | Admitting: Family Medicine

## 2018-10-22 VITALS — BP 112/75 | HR 92 | Temp 97.5°F | Resp 17 | Ht 60.0 in | Wt 171.0 lb

## 2018-10-22 DIAGNOSIS — Z131 Encounter for screening for diabetes mellitus: Secondary | ICD-10-CM | POA: Diagnosis not present

## 2018-10-22 DIAGNOSIS — E282 Polycystic ovarian syndrome: Secondary | ICD-10-CM

## 2018-10-22 DIAGNOSIS — R739 Hyperglycemia, unspecified: Secondary | ICD-10-CM | POA: Diagnosis not present

## 2018-10-22 DIAGNOSIS — Z1329 Encounter for screening for other suspected endocrine disorder: Secondary | ICD-10-CM

## 2018-10-22 DIAGNOSIS — Z0189 Encounter for other specified special examinations: Secondary | ICD-10-CM

## 2018-10-22 DIAGNOSIS — Z130 Encounter for screening for diseases of the blood and blood-forming organs and certain disorders involving the immune mechanism: Secondary | ICD-10-CM | POA: Diagnosis not present

## 2018-10-22 DIAGNOSIS — E669 Obesity, unspecified: Secondary | ICD-10-CM | POA: Diagnosis not present

## 2018-10-22 DIAGNOSIS — Z6833 Body mass index (BMI) 33.0-33.9, adult: Secondary | ICD-10-CM

## 2018-10-22 DIAGNOSIS — Z0000 Encounter for general adult medical examination without abnormal findings: Secondary | ICD-10-CM

## 2018-10-22 DIAGNOSIS — E876 Hypokalemia: Secondary | ICD-10-CM

## 2018-10-22 DIAGNOSIS — N920 Excessive and frequent menstruation with regular cycle: Secondary | ICD-10-CM | POA: Diagnosis not present

## 2018-10-22 DIAGNOSIS — R5383 Other fatigue: Secondary | ICD-10-CM | POA: Diagnosis not present

## 2018-10-22 NOTE — Patient Instructions (Signed)
Thank you for choosing Primary Care at [facility] for your medical home!    Mariah Rosales was seen by Molli Barrows, FNP today.   Axel Filler Cauthorn's primary care provider is Scot Jun, FNP.   For the best care possible,  you should try to see Molli Barrows, FNP  whenever you come to clinic.   We look forward to seeing you again soon!  If you have any questions about your visit today,  please call us at   Or feel free to reach your provider via [place].    Health Maintenance, Female Adopting a healthy lifestyle and getting preventive care can go a long way to promote health and wellness. Talk with your health care provider about what schedule of regular examinations is right for you. This is a good chance for you to check in with your provider about disease prevention and staying healthy. In between checkups, there are plenty of things you can do on your own. Experts have done a lot of research about which lifestyle changes and preventive measures are most likely to keep you healthy. Ask your health care provider for more information. Weight and diet Eat a healthy diet  Be sure to include plenty of vegetables, fruits, low-fat dairy products, and lean protein.  Do not eat a lot of foods high in solid fats, added sugars, or salt.  Get regular exercise. This is one of the most important things you can do for your health. ? Most adults should exercise for at least 150 minutes each week. The exercise should increase your heart rate and make you sweat (moderate-intensity exercise). ? Most adults should also do strengthening exercises at least twice a week. This is in addition to the moderate-intensity exercise.  Maintain a healthy weight  Body mass index (BMI) is a measurement that can be used to identify possible weight problems. It estimates body fat based on height and weight. Your health care provider can help determine your BMI and help you achieve or maintain a  healthy weight.  For females 67 years of age and older: ? A BMI below 18.5 is considered underweight. ? A BMI of 18.5 to 24.9 is normal. ? A BMI of 25 to 29.9 is considered overweight. ? A BMI of 30 and above is considered obese.  Watch levels of cholesterol and blood lipids  You should start having your blood tested for lipids and cholesterol at 31 years of age, then have this test every 5 years.  You may need to have your cholesterol levels checked more often if: ? Your lipid or cholesterol levels are high. ? You are older than 31 years of age. ? You are at high risk for heart disease.  Cancer screening Lung Cancer  Lung cancer screening is recommended for adults 85-22 years old who are at high risk for lung cancer because of a history of smoking.  A yearly low-dose CT scan of the lungs is recommended for people who: ? Currently smoke. ? Have quit within the past 15 years. ? Have at least a 30-pack-year history of smoking. A pack year is smoking an average of one pack of cigarettes a day for 1 year.  Yearly screening should continue until it has been 15 years since you quit.  Yearly screening should stop if you develop a health problem that would prevent you from having lung cancer treatment.  Breast Cancer  Practice breast self-awareness. This means understanding how your breasts normally appear and feel.  It also means doing regular breast self-exams. Let your health care provider know about any changes, no matter how small.  If you are in your 20s or 30s, you should have a clinical breast exam (CBE) by a health care provider every 1-3 years as part of a regular health exam.  If you are 76 or older, have a CBE every year. Also consider having a breast X-ray (mammogram) every year.  If you have a family history of breast cancer, talk to your health care provider about genetic screening.  If you are at high risk for breast cancer, talk to your health care provider about  having an MRI and a mammogram every year.  Breast cancer gene (BRCA) assessment is recommended for women who have family members with BRCA-related cancers. BRCA-related cancers include: ? Breast. ? Ovarian. ? Tubal. ? Peritoneal cancers.  Results of the assessment will determine the need for genetic counseling and BRCA1 and BRCA2 testing.  Cervical Cancer Your health care provider may recommend that you be screened regularly for cancer of the pelvic organs (ovaries, uterus, and vagina). This screening involves a pelvic examination, including checking for microscopic changes to the surface of your cervix (Pap test). You may be encouraged to have this screening done every 3 years, beginning at age 74.  For women ages 15-65, health care providers may recommend pelvic exams and Pap testing every 3 years, or they may recommend the Pap and pelvic exam, combined with testing for human papilloma virus (HPV), every 5 years. Some types of HPV increase your risk of cervical cancer. Testing for HPV may also be done on women of any age with unclear Pap test results.  Other health care providers may not recommend any screening for nonpregnant women who are considered low risk for pelvic cancer and who do not have symptoms. Ask your health care provider if a screening pelvic exam is right for you.  If you have had past treatment for cervical cancer or a condition that could lead to cancer, you need Pap tests and screening for cancer for at least 20 years after your treatment. If Pap tests have been discontinued, your risk factors (such as having a new sexual partner) need to be reassessed to determine if screening should resume. Some women have medical problems that increase the chance of getting cervical cancer. In these cases, your health care provider may recommend more frequent screening and Pap tests.  Colorectal Cancer  This type of cancer can be detected and often prevented.  Routine colorectal cancer  screening usually begins at 31 years of age and continues through 31 years of age.  Your health care provider may recommend screening at an earlier age if you have risk factors for colon cancer.  Your health care provider may also recommend using home test kits to check for hidden blood in the stool.  A small camera at the end of a tube can be used to examine your colon directly (sigmoidoscopy or colonoscopy). This is done to check for the earliest forms of colorectal cancer.  Routine screening usually begins at age 28.  Direct examination of the colon should be repeated every 5-10 years through 31 years of age. However, you may need to be screened more often if early forms of precancerous polyps or small growths are found.  Skin Cancer  Check your skin from head to toe regularly.  Tell your health care provider about any new moles or changes in moles, especially if there is a  change in a mole's shape or color.  Also tell your health care provider if you have a mole that is larger than the size of a pencil eraser.  Always use sunscreen. Apply sunscreen liberally and repeatedly throughout the day.  Protect yourself by wearing long sleeves, pants, a wide-brimmed hat, and sunglasses whenever you are outside.  Heart disease, diabetes, and high blood pressure  High blood pressure causes heart disease and increases the risk of stroke. High blood pressure is more likely to develop in: ? People who have blood pressure in the high end of the normal range (130-139/85-89 mm Hg). ? People who are overweight or obese. ? People who are African American.  If you are 30-45 years of age, have your blood pressure checked every 3-5 years. If you are 52 years of age or older, have your blood pressure checked every year. You should have your blood pressure measured twice-once when you are at a hospital or clinic, and once when you are not at a hospital or clinic. Record the average of the two measurements.  To check your blood pressure when you are not at a hospital or clinic, you can use: ? An automated blood pressure machine at a pharmacy. ? A home blood pressure monitor.  If you are between 34 years and 47 years old, ask your health care provider if you should take aspirin to prevent strokes.  Have regular diabetes screenings. This involves taking a blood sample to check your fasting blood sugar level. ? If you are at a normal weight and have a low risk for diabetes, have this test once every three years after 31 years of age. ? If you are overweight and have a high risk for diabetes, consider being tested at a younger age or more often. Preventing infection Hepatitis B  If you have a higher risk for hepatitis B, you should be screened for this virus. You are considered at high risk for hepatitis B if: ? You were born in a country where hepatitis B is common. Ask your health care provider which countries are considered high risk. ? Your parents were born in a high-risk country, and you have not been immunized against hepatitis B (hepatitis B vaccine). ? You have HIV or AIDS. ? You use needles to inject street drugs. ? You live with someone who has hepatitis B. ? You have had sex with someone who has hepatitis B. ? You get hemodialysis treatment. ? You take certain medicines for conditions, including cancer, organ transplantation, and autoimmune conditions.  Hepatitis C  Blood testing is recommended for: ? Everyone born from [birth year] through 1964. ? Anyone with known risk factors for hepatitis C.  Sexually transmitted infections (STIs)  You should be screened for sexually transmitted infections (STIs) including gonorrhea and chlamydia if: ? You are sexually active and are younger than 31 years of age. ? You are older than 31 years of age and your health care provider tells you that you are at risk for this type of infection. ? Your sexual activity has changed since you were last screened  and you are at an increased risk for chlamydia or gonorrhea. Ask your health care provider if you are at risk.  If you do not have HIV, but are at risk, it may be recommended that you take a prescription medicine daily to prevent HIV infection. This is called pre-exposure prophylaxis (PrEP). You are considered at risk if: ? You are sexually active and do not regularly  use condoms or know the HIV status of your partner(s). ? You take drugs by injection. ? You are sexually active with a partner who has HIV.  Talk with your health care provider about whether you are at high risk of being infected with HIV. If you choose to begin PrEP, you should first be tested for HIV. You should then be tested every 3 months for as long as you are taking PrEP. Pregnancy  If you are premenopausal and you may become pregnant, ask your health care provider about preconception counseling.  If you may become pregnant, take 400 to 800 micrograms (mcg) of folic acid every day.  If you want to prevent pregnancy, talk to your health care provider about birth control (contraception). Osteoporosis and menopause  Osteoporosis is a disease in which the bones lose minerals and strength with aging. This can result in serious bone fractures. Your risk for osteoporosis can be identified using a bone density scan.  If you are 23 years of age or older, or if you are at risk for osteoporosis and fractures, ask your health care provider if you should be screened.  Ask your health care provider whether you should take a calcium or vitamin D supplement to lower your risk for osteoporosis.  Menopause may have certain physical symptoms and risks.  Hormone replacement therapy may reduce some of these symptoms and risks. Talk to your health care provider about whether hormone replacement therapy is right for you. Follow these instructions at home:  Schedule regular health, dental, and eye exams.  Stay current with your  immunizations.  Do not use any tobacco products including cigarettes, chewing tobacco, or electronic cigarettes.  If you are pregnant, do not drink alcohol.  If you are breastfeeding, limit how much and how often you drink alcohol.  Limit alcohol intake to no more than 1 drink per day for nonpregnant women. One drink equals 12 ounces of beer, 5 ounces of wine, or 1 ounces of hard liquor.  Do not use street drugs.  Do not share needles.  Ask your health care provider for help if you need support or information about quitting drugs.  Tell your health care provider if you often feel depressed.  Tell your health care provider if you have ever been abused or do not feel safe at home. This information is not intended to replace advice given to you by your health care provider. Make sure you discuss any questions you have with your health care provider. Document Released: 07/01/2011 Document Revised: 05/23/2016 Document Reviewed: 09/19/2015 Elsevier Interactive Patient Education  [employer].

## 2018-10-22 NOTE — Progress Notes (Signed)
Mariah Rosales, is a 32 y.o. female  MVH:[id number]  [id number]:[id number]  DOB - 05-Dec-1986  CC:  Chief Complaint  Patient presents with  . Establish Care  . Labs Only    would like to have screening labs for DM, Vitamin D deficiency & anemia       HPI: Mariah Rosales is a 32 y.o. female is here today to establish care.   Anselmo Rod does not have a problem list on file.    Today's visit:  Mariah Rosales is here today for routine screening labs. She has no specific concern. Reports a history of low potassium which was replaced in 2018. Concern for anemia due to very heavy menstrual periods. She suffers from PCOS and is followed by [facility]. Continues have monthly periods which last in duration of 5 days. She denies new headaches, chest pain, abdominal pain, nausea, new weakness , numbness or tingling, SOB, edema, or worrisome cough.   Current medications:  Pertinent family medical history: family history includes Bipolar disorder in her mother; GER disease in her father; Throat cancer in her father.   No Known Allergies  Social History   Socioeconomic History  . Marital status: Single    Spouse name: Not on file  . Number of children: Not on file  . Years of education: Not on file  . Highest education level: Not on file  Occupational History  . Not on file  Social Needs  . Financial resource strain: Not on file  . Food insecurity:    Worry: Not on file    Inability: Not on file  . Transportation needs:    Medical: Not on file    Non-medical: Not on file  Tobacco Use  . Smoking status: Never Smoker  . Smokeless tobacco: Never Used  Substance and Sexual Activity  . Alcohol use: No  . Drug use: No  . Sexual activity: Not on file  Lifestyle  . Physical activity:    Days per week: Not on file    Minutes per session: Not on file  . Stress: Not on file  Relationships  . Social connections:    Talks on phone: Not on file    Gets together: Not on file   Attends religious service: Not on file    Active member of club or organization: Not on file    Attends meetings of clubs or organizations: Not on file    Relationship status: Not on file  . Intimate partner violence:    Fear of current or ex partner: Not on file    Emotionally abused: Not on file    Physically abused: Not on file    Forced sexual activity: Not on file  Other Topics Concern  . Not on file  Social History Narrative  . Not on file    Review of Systems: Constitutional: Negative for fever, chills, diaphoresis, activity change, appetite change and fatigue. HENT: Negative for ear pain, nosebleeds, congestion, facial swelling, rhinorrhea, neck pain, neck stiffness and ear discharge.  Eyes: Negative for pain, discharge, redness, itching and visual disturbance. Respiratory: Negative for cough, choking, chest tightness, shortness of breath, wheezing and stridor.  Cardiovascular: Negative for chest pain, palpitations and leg swelling. Gastrointestinal: Negative for abdominal distention. Genitourinary: Negative for dysuria, urgency, frequency, hematuria, flank pain, decreased urine volume, difficulty urinating. Musculoskeletal: Negative for back pain, joint swelling, arthralgia and gait problem. Neurological: Negative for dizziness, tremors, seizures, syncope, facial asymmetry, speech difficulty, weakness, light-headedness, numbness and headaches.  Hematological:  Negative for adenopathy. Does not bruise/bleed easily. Psychiatric/Behavioral: Negative for hallucinations, behavioral problems, confusion, dysphoric mood, decreased concentration and agitation.    Objective:   Vitals:   10/22/18 1430  BP: 112/75  Pulse: 92  Resp: 17  Temp: (!) 97.5 F (36.4 C)  SpO2: 99%    BP Readings from Last 3 Encounters:  10/22/18 112/75  09/18/18 112/73  08/28/17 102/69    Filed Weights   10/22/18 1430  Weight: 171 lb (77.6 kg)      Physical Exam: Constitutional: Patient  appears well-developed and well-nourished. No distress. HENT: Normocephalic, atraumatic, External right and left ear normal. Oropharynx is clear and moist.  Eyes: Conjunctivae and EOM are normal. PERRLA, no scleral icterus. Neck: Normal ROM. Neck supple. No JVD. No tracheal deviation. No thyromegaly. CVS: RRR, S1/S2 +, no murmurs, no gallops, no carotid bruit.  Pulmonary: Effort and breath sounds normal, no stridor, rhonchi, wheezes, rales.  Abdominal: Soft. BS +, no distension, tenderness, rebound or guarding.  Musculoskeletal: Normal range of motion. No edema and no tenderness.  Neuro: Alert. Normal muscle tone coordination. Normal gait. . Skin: Skin is warm and dry. No rash noted. Not diaphoretic. No erythema. No pallor. Psychiatric: Normal mood and affect. Behavior, judgment, thought content normal.  Lab Results (prior encounters)  Lab Results  Component Value Date   WBC 6.4 04/14/2016   HGB 13.3 07/04/2017   HCT 39.0 07/04/2017   MCV 83.2 04/14/2016   PLT 226 04/14/2016   Lab Results  Component Value Date   CREATININE 0.70 07/04/2017   BUN 10 07/04/2017   NA 141 07/04/2017   K 3.5 07/04/2017   CL 104 07/04/2017   CO2 26 04/14/2016        Assessment and plan:  1. Routine medical exam 2. Encounter for routine laboratory testing Age-appropriate anticipatory guidance provided.  3. Screening for thyroid disorder - Thyroid Panel With TSH; Future  4. Fatigue, unspecified type - Thyroid Panel With TSH; Future  5. Screening for diabetes mellitus - Comprehensive metabolic panel; Future  6. Hypokalemia, history of  - Comprehensive metabolic panel; Future  7. Screening for deficiency anemia  8. PCOS (polycystic ovarian syndrome) - Hemoglobin A1c; Future - CBC with Differential - Lipid panel  9. Hyperglycemia - Hemoglobin A1c; Future  10. Menorrhagia with regular cycle - CBC with Differential  11. Class 1 obesity without serious comorbidity with body mass index  (BMI) of 33.0 to 33.9 in adult, unspecified obesity type Encouraged efforts to reduce weight include engaging in physical activity as tolerated with goal of 150 minutes per week. Improve dietary choices and eat a meal regimen consistent with a Mediterranean or DASH diet. Reduce simple carbohydrates. Do not skip meals and eat healthy snacks throughout the day to avoid over-eating at dinner. Set a goal weight loss that is achievable for you. - Lipid panel   Return in about 6 months (around 04/23/2019).   The patient was given clear instructions to go to ER or return to medical center if symptoms don't improve, worsen or new problems develop. The patient verbalized understanding. The patient was advised  to call and obtain lab results if they haven't heard anything from out office within 7-10 business days.  Orders Placed This Encounter  Procedures  . Comprehensive metabolic panel    Standing Status:   Future    Standing Expiration Date:   10/23/2019    Order Specific Question:   Has the patient fasted?    Answer:   No  .  Thyroid Panel With TSH    Standing Status:   Future    Standing Expiration Date:   10/23/2019  . Hemoglobin A1c    Standing Status:   Future    Standing Expiration Date:   10/23/2019  . CBC with Differential  . Lipid panel    Order Specific Question:   Has the patient fasted?    Answer:   No     Joaquin Courts, FNP Primary Care at [facility] [address], [place] [address] [phone]: [phone]    This note has been created with Dragon speech recognition software and Paediatric nurse. Any transcriptional errors are unintentional.

## 2019-07-28 ENCOUNTER — Encounter (HOSPITAL_COMMUNITY): Payer: Self-pay

## 2019-07-28 ENCOUNTER — Other Ambulatory Visit: Payer: Self-pay

## 2019-07-28 ENCOUNTER — Ambulatory Visit (HOSPITAL_COMMUNITY)
Admission: EM | Admit: 2019-07-28 | Discharge: 2019-07-28 | Disposition: A | Discharge status: Home / Self Care | Payer: Medicare Other | Attending: Emergency Medicine | Admitting: Emergency Medicine

## 2019-07-28 DIAGNOSIS — R21 Rash and other nonspecific skin eruption: Secondary | ICD-10-CM

## 2019-07-28 MED ORDER — TRIAMCINOLONE ACETONIDE 0.1 % EX CREA: 1.0000 "application " | TOPICAL_CREAM | Freq: Two times a day (BID) | CUTANEOUS | 0 refills | Status: AC

## 2019-07-28 MED ORDER — CETIRIZINE HCL 10 MG PO CAPS: 10.0000 mg | ORAL_CAPSULE | Freq: Every day | ORAL | 0 refills | Status: AC

## 2019-07-28 MED ORDER — PREDNISONE 50 MG PO TABS: 50.0000 mg | ORAL_TABLET | Freq: Every day | ORAL | 0 refills | Status: AC

## 2019-07-28 NOTE — ED Triage Notes (Signed)
Pt state she has a rash arms, hand and knees. X 1 week.

## 2019-07-28 NOTE — Discharge Instructions (Signed)
Begin prednisone daily for 5 days, take with food and in the morning if you are able Daily cetirizine in the morning Benadryl at nighttime May apply triamcinolone/Kenalog cream twice daily to areas, use thin amount Please wash linens in hot water Please follow-up if rash not resolving, worsening, changing

## 2019-07-29 NOTE — ED Provider Notes (Signed)
[facility]    CSN: [id number] Arrival date & time: 07/28/19  1909      History   Chief Complaint Chief Complaint  Patient presents with  . Rash    HPI Mariah Rosales is a 33 y.o. female history of PCOS, GERD, presenting today for evaluation of a rash.  Patient states that over the past week she has developed a rash to her extremities and face.  She has had very mild itching associated with this.  Denies close contacts with similar rash.  Denies any new soaps, lotions, detergents or hygiene products.  Denies any new foods or medicines.  Denies history of similar rash.  She has not tried OTC remedies.  Denies associated fever, nausea, vomiting, headaches.  Denies associated URI symptoms.  HPI  Past Medical History:  Diagnosis Date  . GERD (gastroesophageal reflux disease)   . Ovarian cyst   . PCOS (polycystic ovarian syndrome)     Patient Active Problem List   Diagnosis Date Noted  . Class 1 obesity without serious comorbidity with body mass index (BMI) of 33.0 to 33.9 in adult 10/22/2018    History reviewed. No pertinent surgical history.  OB History    Gravida  1   Para      Term      Preterm      AB      Living        SAB      TAB      Ectopic      Multiple      Live Births               Home Medications    Prior to Admission medications   Medication Sig Start Date End Date Taking? Authorizing Provider  Cetirizine HCl 10 MG CAPS Take 1 capsule (10 mg total) by mouth daily for 10 days. 07/28/19 08/07/19  Jenelle Drennon C, PA-C  predniSONE (DELTASONE) 50 MG tablet Take 1 tablet (50 mg total) by mouth daily for 5 days. 07/28/19 08/02/19  Shiela Bruns C, PA-C  triamcinolone cream (KENALOG) 0.1 % Apply 1 application topically 2 (two) times daily. 07/28/19   Delita Chiquito, Junius CreamerHallie C, PA-C    Family History Family History  Problem Relation Age of Onset  . Bipolar disorder Mother   . GER disease Father   . Throat cancer Father   . Heart  disease Neg Hx     Social History Social History   Tobacco Use  . Smoking status: Never Smoker  . Smokeless tobacco: Never Used  Substance Use Topics  . Alcohol use: No  . Drug use: No     Allergies   Patient has no known allergies.   Review of Systems Review of Systems  Constitutional: Negative for fatigue and fever.  HENT: Negative for mouth sores.   Eyes: Negative for visual disturbance.  Respiratory: Negative for shortness of breath.   Cardiovascular: Negative for chest pain.  Gastrointestinal: Negative for abdominal pain, nausea and vomiting.  Genitourinary: Negative for genital sores.  Musculoskeletal: Negative for arthralgias and joint swelling.  Skin: Positive for rash. Negative for color change and wound.  Neurological: Negative for dizziness, weakness, light-headedness and headaches.     Physical Exam Triage Vital Signs ED Triage Vitals  Enc Vitals Group     BP 07/28/19 1937 125/80     Pulse Rate 07/28/19 1937 88     Resp 07/28/19 1937 18     Temp 07/28/19 1937 98.2 F (  36.8 C)     Temp Source 07/28/19 1937 Oral     SpO2 07/28/19 1937 100 %     Weight 07/28/19 1935 176 lb 9.6 oz (80.1 kg)     Height --      Head Circumference --      Peak Flow --      Pain Score 07/28/19 1934 1     Pain Loc --      Pain Edu? --      Excl. in GC? --    No data found.  Updated Vital Signs BP 125/80 (BP Location: Right Arm)   Pulse 88   Temp 98.2 F (36.8 C) (Oral)   Resp 18   Wt 176 lb 9.6 oz (80.1 kg)   LMP 07/15/2019   SpO2 100%   BMI 34.49 kg/m   Visual Acuity Right Eye Distance:   Left Eye Distance:   Bilateral Distance:    Right Eye Near:   Left Eye Near:    Bilateral Near:     Physical Exam Vitals signs and nursing note reviewed.  Constitutional:      Appearance: She is well-developed.     Comments: No acute distress  HENT:     Head: Normocephalic and atraumatic.     Nose: Nose normal.     Mouth/Throat:     Comments: Oral mucosa pink  and moist, no tonsillar enlargement or exudate. Posterior pharynx patent and nonerythematous, no uvula deviation or swelling. Normal phonation. No lesions on oral mucosa Eyes:     Extraocular Movements: Extraocular movements intact.     Conjunctiva/sclera: Conjunctivae normal.     Pupils: Pupils are equal, round, and reactive to light.  Neck:     Musculoskeletal: Neck supple.  Cardiovascular:     Rate and Rhythm: Normal rate.  Pulmonary:     Effort: Pulmonary effort is normal. No respiratory distress.  Abdominal:     General: There is no distension.  Musculoskeletal: Normal range of motion.  Skin:    General: Skin is warm and dry.     Comments: Extremities with various areas of multiple papular skin colored bumps, few appear slightly erythematous with signs of slight excoriation.  Face with more clustered papular skin colored lesions surrounding bilateral orbits as well as along chin and perioral area  No lesions noted to palms or soles  Neurological:     Mental Status: She is alert and oriented to person, place, and time.      UC Treatments / Results  Labs (all labs ordered are listed, but only abnormal results are displayed) Labs Reviewed - No data to display  EKG   Radiology No results found.  Procedures Procedures (including critical care time)  Medications Ordered in UC Medications - No data to display  Initial Impression / Assessment and Plan / UC Course  I have reviewed the triage vital signs and the nursing notes.  Pertinent labs & imaging results that were available during my care of the patient were reviewed by me and considered in my medical decision making (see chart for details).     Patient with faint papular rash, trunk sparing, vital signs stable, no associated systemic symptoms.  Possible allergic reaction given distribution, but no significant itching.  Unclear cause of rash-seems less likely fungal, does not appear infectious.  Will do trial of  prednisone x5 days, antihistamines.  Continue to monitor,Discussed strict return precautions. Patient verbalized understanding and is agreeable with plan.  Final Clinical Impressions(s) /  UC Diagnoses   Final diagnoses:  Rash and nonspecific skin eruption     Discharge Instructions     Begin prednisone daily for 5 days, take with food and in the morning if you are able Daily cetirizine in the morning Benadryl at nighttime May apply triamcinolone/Kenalog cream twice daily to areas, use thin amount Please wash linens in hot water Please follow-up if rash not resolving, worsening, changing   ED Prescriptions    Medication Sig Dispense Auth. Provider   predniSONE (DELTASONE) 50 MG tablet Take 1 tablet (50 mg total) by mouth daily for 5 days. 5 tablet Tarron Krolak C, PA-C   Cetirizine HCl 10 MG CAPS Take 1 capsule (10 mg total) by mouth daily for 10 days. 10 capsule Janya Eveland C, PA-C   triamcinolone cream (KENALOG) 0.1 % Apply 1 application topically 2 (two) times daily. 30 g Florabelle Cardin, [place] C, PA-C     Controlled Substance Prescriptions [place] Controlled Substance Registry consulted? Not Applicable   Janith Lima, [place] 07/29/19 [id number]

## 2019-10-20 ENCOUNTER — Ambulatory Visit: Payer: Medicare Other | Admitting: Registered"

## 2019-12-13 ENCOUNTER — Encounter (HOSPITAL_COMMUNITY): Payer: Self-pay

## 2019-12-13 ENCOUNTER — Other Ambulatory Visit: Payer: Self-pay

## 2019-12-13 ENCOUNTER — Ambulatory Visit (HOSPITAL_COMMUNITY)
Admission: EM | Admit: 2019-12-13 | Discharge: 2019-12-13 | Disposition: A | Discharge status: Home / Self Care | Payer: Medicare Other | Attending: Internal Medicine | Admitting: Internal Medicine

## 2019-12-13 DIAGNOSIS — K644 Residual hemorrhoidal skin tags: Secondary | ICD-10-CM

## 2019-12-13 MED ORDER — SENNOSIDES-DOCUSATE SODIUM 8.6-50 MG PO TABS: 1.0000 | ORAL_TABLET | Freq: Every day | ORAL | 1 refills | Status: AC

## 2019-12-13 MED ORDER — HYDROCORTISONE ACETATE 25 MG RE SUPP: 25.0000 mg | Freq: Two times a day (BID) | RECTAL | 0 refills | Status: DC

## 2019-12-13 NOTE — ED Provider Notes (Signed)
[facility]    CSN: [id number] Arrival date & time: 12/13/19  1737      History   Chief Complaint Chief Complaint  Patient presents with  . Hemorrhoids    HPI Mariah Rosales is a 33 y.o. female with no past medical history comes to urgent care with a 2-day history of rectal pain and perianal swelling.  Pain is constant, severe currently 10 out of 10.  No known relieving factors.  Aggravated by straining and palpation.  No rectal bleeding.  No abdominal pain.  Patient has a history of constipation but does not take any medications to regularize her bowel movements.  No weight loss.   HPI  Past Medical History:  Diagnosis Date  . GERD (gastroesophageal reflux disease)   . Ovarian cyst   . PCOS (polycystic ovarian syndrome)     Patient Active Problem List   Diagnosis Date Noted  . Class 1 obesity without serious comorbidity with body mass index (BMI) of 33.0 to 33.9 in adult 10/22/2018    History reviewed. No pertinent surgical history.  OB History    Gravida  1   Para      Term      Preterm      AB      Living        SAB      TAB      Ectopic      Multiple      Live Births               Home Medications    Prior to Admission medications   Medication Sig Start Date End Date Taking? Authorizing Provider  Cetirizine HCl 10 MG CAPS Take 1 capsule (10 mg total) by mouth daily for 10 days. 07/28/19 08/07/19  Wieters, Hallie C, PA-C  hydrocortisone (ANUSOL-HC) 25 MG suppository Place 1 suppository (25 mg total) rectally 2 (two) times daily. 12/13/19   Mariah Rosales, Myrene Galas, MD  senna-docusate (SENOKOT-S) 8.6-50 MG tablet Take 1 tablet by mouth daily. 12/13/19 01/12/20  Chase Picket, MD  triamcinolone cream (KENALOG) 0.1 % Apply 1 application topically 2 (two) times daily. 07/28/19   Wieters, Elesa Hacker, PA-C    Family History Family History  Problem Relation Age of Onset  . Bipolar disorder Mother   . GER disease Father   . Throat cancer  Father   . Heart disease Neg Hx     Social History Social History   Tobacco Use  . Smoking status: Never Smoker  . Smokeless tobacco: Never Used  Substance Use Topics  . Alcohol use: No  . Drug use: No     Allergies   Patient has no known allergies.   Review of Systems Review of Systems  Constitutional: Negative.   HENT: Negative.   Respiratory: Negative for cough, chest tightness and shortness of breath.   Cardiovascular: Negative for chest pain and palpitations.  Gastrointestinal: Positive for rectal pain. Negative for anal bleeding, diarrhea, nausea and vomiting.  Genitourinary: Negative for dysuria, frequency and urgency.  Musculoskeletal: Negative.   Neurological: Negative.      Physical Exam Triage Vital Signs ED Triage Vitals  Enc Vitals Group     BP 12/13/19 1831 114/73     Pulse Rate 12/13/19 1831 86     Resp 12/13/19 1831 16     Temp 12/13/19 1831 98.2 F (36.8 C)     Temp Source 12/13/19 1831 Oral     SpO2 12/13/19 1831  100 %     Weight --      Height --      Head Circumference --      Peak Flow --      Pain Score 12/13/19 1830 9     Pain Loc --      Pain Edu? --      Excl. in GC? --    No data found.  Updated Vital Signs BP 114/73 (BP Location: Right Arm)   Pulse 86   Temp 98.2 F (36.8 C) (Oral)   Resp 16   LMP 11/30/2019   SpO2 100%   Visual Acuity Right Eye Distance:   Left Eye Distance:   Bilateral Distance:    Right Eye Near:   Left Eye Near:    Bilateral Near:     Physical Exam Vitals and nursing note reviewed.  Constitutional:      General: She is in acute distress.     Appearance: Normal appearance. She is not ill-appearing.  Pulmonary:     Effort: Pulmonary effort is normal.     Breath sounds: Normal breath sounds.  Abdominal:     General: Bowel sounds are normal.     Palpations: Abdomen is soft. There is no mass.     Tenderness: There is no abdominal tenderness. There is no guarding or rebound.     Comments:  External hemorrhoid at 5 o'clock position.  No bleeding.  Musculoskeletal:        General: Normal range of motion.  Skin:    Capillary Refill: Capillary refill takes less than 2 seconds.  Neurological:     General: No focal deficit present.     Mental Status: She is alert.      UC Treatments / Results  Labs (all labs ordered are listed, but only abnormal results are displayed) Labs Reviewed - No data to display  EKG   Radiology No results found.  Procedures Procedures (including critical care time)  Medications Ordered in UC Medications - No data to display  Initial Impression / Assessment and Plan / UC Course  I have reviewed the triage vital signs and the nursing notes.  Pertinent labs & imaging results that were available during my care of the patient were reviewed by me and considered in my medical decision making (see chart for details).     1.  Rectal pain secondary to thrombosed external hemorrhoid: Anusol Senna 1 tablet daily Patient is advised to increase fiber intake If patient's symptoms get worse he/she is advised to return to the urgent care to be reevaluated.  Final Clinical Impressions(s) / UC Diagnoses   Final diagnoses:  Inflamed external hemorrhoid   Discharge Instructions   None    ED Prescriptions    Medication Sig Dispense Auth. Provider   hydrocortisone (ANUSOL-HC) 25 MG suppository Place 1 suppository (25 mg total) rectally 2 (two) times daily. 12 suppository Martisha Toulouse, Britta Mccreedy, MD   senna-docusate (SENOKOT-S) 8.6-50 MG tablet Take 1 tablet by mouth daily. 30 tablet Marsi Turvey, Britta Mccreedy, MD     PDMP not reviewed this encounter.   Merrilee Jansky, MD 12/13/19 2008

## 2019-12-13 NOTE — ED Triage Notes (Signed)
Pt present a hemorrhoid, she is not able to sit long period of time and is having a lot of pressure in her anus. Symptoms started today

## 2019-12-15 ENCOUNTER — Ambulatory Visit (HOSPITAL_COMMUNITY)
Admission: EM | Admit: 2019-12-15 | Discharge: 2019-12-15 | Discharge status: Against Medical Advice | Payer: Medicare Other

## 2020-02-03 ENCOUNTER — Other Ambulatory Visit: Payer: Self-pay

## 2020-02-03 ENCOUNTER — Ambulatory Visit (HOSPITAL_COMMUNITY)
Admission: EM | Admit: 2020-02-03 | Discharge: 2020-02-03 | Disposition: A | Discharge status: Home / Self Care | Payer: Medicare Other

## 2020-02-03 ENCOUNTER — Encounter (HOSPITAL_COMMUNITY): Payer: Self-pay | Admitting: Emergency Medicine

## 2020-02-03 DIAGNOSIS — K5900 Constipation, unspecified: Secondary | ICD-10-CM

## 2020-02-03 DIAGNOSIS — K644 Residual hemorrhoidal skin tags: Secondary | ICD-10-CM | POA: Diagnosis not present

## 2020-02-03 MED ORDER — DOCUSATE SODIUM 100 MG PO CAPS: 100.0000 mg | ORAL_CAPSULE | Freq: Every day | ORAL | 0 refills | Status: AC

## 2020-02-03 MED ORDER — HYDROCORTISONE ACETATE 25 MG RE SUPP: 25.0000 mg | Freq: Two times a day (BID) | RECTAL | 0 refills | Status: AC

## 2020-02-03 NOTE — Discharge Instructions (Addendum)
For constipation, make sure you hydrate well every day with about 64 ounces of water daily (that is 2 liters).  Try to avoid carb heavy foods, dairy. This includes cutting out breads, pasta, pizza, pastries, potatoes, rice, starchy foods in general. Eat more fiber as listed below:  Salads - kale, spinach, cabbage, spring mix; use seeds like pumpkin seeds or sunflower seeds, almonds; you can also use 1-2 hard boiled eggs in your salads Fruits - avocadoes, berries (blueberries, raspberries, blackberries), apples, oranges, pomegranate, grapefruit Vegetables - aspargus, cauliflower, broccoli, green beans, brussel spouts, bell peppers; stay away from starchy vegetables like potatoes, carrots, peas  Regarding meat it is better to eat lean meats and limit your red meat consumption including pork.  Wild caught fish, chicken breast are good options.  Do not eat any foods on this list that you are allergic to.

## 2020-02-03 NOTE — ED Triage Notes (Signed)
Rectal pain started yesterday, history of the same

## 2020-02-03 NOTE — ED Provider Notes (Signed)
[facility]   MRN: [id number] DOB: [birth year]  Subjective:   Mariah Rosales is a 34 y.o. female presenting for acute onset of recurrent anal pain with a small mass.  Patient was recently diagnosed with external hemorrhoids, states that she got hydrocortisone suppositories is helped a lot.  Unfortunately the hemorrhoid is back.  Admits dietary noncompliance but states that she does not know what kind of foods to eat.  She regularly struggles with constipation but has very little fiber in her diet.  No current facility-administered medications for this encounter.  Current Outpatient Medications:  Marland Kitchen  METFORMIN HCL PO, Take by mouth., Disp: , Rfl:  .  Cetirizine HCl 10 MG CAPS, Take 1 capsule (10 mg total) by mouth daily for 10 days., Disp: 10 capsule, Rfl: 0 .  hydrocortisone (ANUSOL-HC) 25 MG suppository, Place 1 suppository (25 mg total) rectally 2 (two) times daily., Disp: 12 suppository, Rfl: 0 .  triamcinolone cream (KENALOG) 0.1 %, Apply 1 application topically 2 (two) times daily., Disp: 30 g, Rfl: 0   No Known Allergies  Past Medical History:  Diagnosis Date  . GERD (gastroesophageal reflux disease)   . Ovarian cyst   . PCOS (polycystic ovarian syndrome)      History reviewed. No pertinent surgical history.  Family History  Problem Relation Age of Onset  . Bipolar disorder Mother   . GER disease Father   . Throat cancer Father   . Heart disease Neg Hx     Social History   Tobacco Use  . Smoking status: Never Smoker  . Smokeless tobacco: Never Used  Substance Use Topics  . Alcohol use: No  . Drug use: No    ROS   Objective:   Vitals: BP 121/87 (BP Location: Left Arm)   Pulse 90   Temp 97.8 F (36.6 C) (Oral)   Resp 18   SpO2 98%   Physical Exam Constitutional:      General: She is not in acute distress.    Appearance: Normal appearance. She is well-developed. She is not ill-appearing.  HENT:     Head: Normocephalic and atraumatic.   Nose: Nose normal.     Mouth/Throat:     Mouth: Mucous membranes are moist.     Pharynx: Oropharynx is clear.  Eyes:     General: No scleral icterus.    Extraocular Movements: Extraocular movements intact.     Pupils: Pupils are equal, round, and reactive to light.  Cardiovascular:     Rate and Rhythm: Normal rate.  Pulmonary:     Effort: Pulmonary effort is normal.  Genitourinary:      Comments: Patient refused exam, states that she took a picture showing a hemorrhoid.  I reviewed picture and area outlined on diagram shows nonthrombosed external hemorrhoid. Skin:    General: Skin is warm and dry.  Neurological:     General: No focal deficit present.     Mental Status: She is alert and oriented to person, place, and time.  Psychiatric:        Mood and Affect: Mood normal.        Behavior: Behavior normal.     Assessment and Plan :   1. External hemorrhoid   2. Constipation, unspecified constipation type     Emphasized need for dietary modifications to avoid persistent constipation as a source of her external hemorrhoids.  Refilled her hydrocortisone suppositories.  Start docusate 100 mg to help as a stool softener. Counseled patient on potential  for adverse effects with medications prescribed/recommended today, ER and return-to-clinic precautions discussed, patient verbalized understanding.    Wallis Bamberg, [place] 02/03/20 1945

## 2020-02-03 NOTE — ED Notes (Signed)
Refuses to get undressed unless provider tells her to get undressed
